# Patient Record
Sex: Female | Born: 1970
Health system: Southern US, Community
[De-identification: ages and names within clinical notes are randomized; demographics above are authoritative.]

## PROBLEM LIST (undated history)

## (undated) DIAGNOSIS — E6609 Other obesity due to excess calories: Secondary | ICD-10-CM

## (undated) DIAGNOSIS — R42 Dizziness and giddiness: Secondary | ICD-10-CM

## (undated) DIAGNOSIS — E119 Type 2 diabetes mellitus without complications: Secondary | ICD-10-CM

## (undated) DIAGNOSIS — IMO0001 Reserved for inherently not codable concepts without codable children: Secondary | ICD-10-CM

## (undated) DIAGNOSIS — E781 Pure hyperglyceridemia: Secondary | ICD-10-CM

## (undated) DIAGNOSIS — I10 Essential (primary) hypertension: Secondary | ICD-10-CM

## (undated) DIAGNOSIS — R51 Headache: Secondary | ICD-10-CM

## (undated) DIAGNOSIS — I519 Heart disease, unspecified: Secondary | ICD-10-CM

## (undated) DIAGNOSIS — R519 Headache, unspecified: Secondary | ICD-10-CM

## (undated) DIAGNOSIS — D649 Anemia, unspecified: Secondary | ICD-10-CM

## (undated) HISTORY — DX: Other obesity due to excess calories: E66.09

## (undated) HISTORY — DX: Essential (primary) hypertension: I10

## (undated) HISTORY — PX: BREAST SURGERY: SHX581

## (undated) HISTORY — DX: Pure hyperglyceridemia: E78.1

## (undated) HISTORY — DX: Headache: R51

## (undated) HISTORY — DX: Heart disease, unspecified: I51.9

## (undated) HISTORY — DX: Type 2 diabetes mellitus without complications: E11.9

## (undated) HISTORY — DX: Headache, unspecified: R51.9

---

## 1989-09-25 HISTORY — PX: BREAST BIOPSY: SHX20

## 1999-03-18 ENCOUNTER — Other Ambulatory Visit: Admission: RE | Admit: 1999-03-18 | Discharge: 1999-03-18 | Payer: Self-pay | Admitting: Obstetrics & Gynecology

## 2001-04-03 ENCOUNTER — Other Ambulatory Visit: Admission: RE | Admit: 2001-04-03 | Discharge: 2001-04-03 | Payer: Self-pay | Admitting: Obstetrics & Gynecology

## 2002-04-04 ENCOUNTER — Other Ambulatory Visit: Admission: RE | Admit: 2002-04-04 | Discharge: 2002-04-04 | Payer: Self-pay | Admitting: Obstetrics & Gynecology

## 2002-08-05 ENCOUNTER — Inpatient Hospital Stay (HOSPITAL_COMMUNITY): Admission: AD | Admit: 2002-08-05 | Discharge: 2002-08-07 | Payer: Self-pay | Admitting: Obstetrics & Gynecology

## 2002-09-11 ENCOUNTER — Other Ambulatory Visit: Admission: RE | Admit: 2002-09-11 | Discharge: 2002-09-11 | Payer: Self-pay | Admitting: Obstetrics & Gynecology

## 2003-05-11 ENCOUNTER — Other Ambulatory Visit: Admission: RE | Admit: 2003-05-11 | Discharge: 2003-05-11 | Payer: Self-pay | Admitting: Obstetrics & Gynecology

## 2004-09-25 HISTORY — PX: HERNIA REPAIR: SHX51

## 2005-09-21 ENCOUNTER — Other Ambulatory Visit: Admission: RE | Admit: 2005-09-21 | Discharge: 2005-09-21 | Payer: Self-pay | Admitting: Obstetrics & Gynecology

## 2007-09-06 ENCOUNTER — Encounter: Admission: RE | Admit: 2007-09-06 | Discharge: 2007-09-06 | Payer: Self-pay | Admitting: Obstetrics & Gynecology

## 2008-03-23 ENCOUNTER — Encounter: Admission: RE | Admit: 2008-03-23 | Discharge: 2008-03-23 | Payer: Self-pay | Admitting: Obstetrics & Gynecology

## 2008-09-14 ENCOUNTER — Encounter: Admission: RE | Admit: 2008-09-14 | Discharge: 2008-09-14 | Payer: Self-pay | Admitting: Obstetrics & Gynecology

## 2009-09-15 ENCOUNTER — Encounter: Admission: RE | Admit: 2009-09-15 | Discharge: 2009-09-15 | Payer: Self-pay | Admitting: Obstetrics & Gynecology

## 2010-04-26 ENCOUNTER — Ambulatory Visit: Payer: Self-pay | Admitting: Cardiology

## 2010-04-27 ENCOUNTER — Ambulatory Visit: Payer: Self-pay | Admitting: Cardiology

## 2010-09-27 ENCOUNTER — Ambulatory Visit: Payer: Self-pay | Admitting: Cardiology

## 2011-05-08 ENCOUNTER — Other Ambulatory Visit: Payer: Self-pay | Admitting: *Deleted

## 2011-05-08 DIAGNOSIS — E7800 Pure hypercholesterolemia, unspecified: Secondary | ICD-10-CM

## 2011-09-13 ENCOUNTER — Encounter: Payer: Self-pay | Admitting: *Deleted

## 2011-09-13 DIAGNOSIS — E781 Pure hyperglyceridemia: Secondary | ICD-10-CM | POA: Insufficient documentation

## 2011-09-13 DIAGNOSIS — R519 Headache, unspecified: Secondary | ICD-10-CM | POA: Insufficient documentation

## 2011-09-13 DIAGNOSIS — E6609 Other obesity due to excess calories: Secondary | ICD-10-CM | POA: Insufficient documentation

## 2011-09-13 DIAGNOSIS — R079 Chest pain, unspecified: Secondary | ICD-10-CM | POA: Insufficient documentation

## 2011-09-15 ENCOUNTER — Other Ambulatory Visit: Payer: Self-pay | Admitting: *Deleted

## 2011-09-15 ENCOUNTER — Ambulatory Visit (INDEPENDENT_AMBULATORY_CARE_PROVIDER_SITE_OTHER): Payer: BC Managed Care – PPO | Admitting: Cardiology

## 2011-09-15 ENCOUNTER — Other Ambulatory Visit (INDEPENDENT_AMBULATORY_CARE_PROVIDER_SITE_OTHER): Payer: BC Managed Care – PPO | Admitting: *Deleted

## 2011-09-15 ENCOUNTER — Encounter: Payer: Self-pay | Admitting: Cardiology

## 2011-09-15 VITALS — BP 138/92 | HR 78 | Ht 64.0 in | Wt 239.0 lb

## 2011-09-15 DIAGNOSIS — E669 Obesity, unspecified: Secondary | ICD-10-CM

## 2011-09-15 DIAGNOSIS — I119 Hypertensive heart disease without heart failure: Secondary | ICD-10-CM

## 2011-09-15 DIAGNOSIS — E6609 Other obesity due to excess calories: Secondary | ICD-10-CM

## 2011-09-15 DIAGNOSIS — E7800 Pure hypercholesterolemia, unspecified: Secondary | ICD-10-CM

## 2011-09-15 LAB — LIPID PANEL
Cholesterol: 130 mg/dL (ref 0–200)
Total CHOL/HDL Ratio: 3

## 2011-09-15 LAB — HEPATIC FUNCTION PANEL
AST: 16 U/L (ref 0–37)
Alkaline Phosphatase: 66 U/L (ref 39–117)
Total Protein: 7.2 g/dL (ref 6.0–8.3)

## 2011-09-15 LAB — BASIC METABOLIC PANEL
BUN: 12 mg/dL (ref 6–23)
CO2: 27 mEq/L (ref 19–32)
Calcium: 8.8 mg/dL (ref 8.4–10.5)
Chloride: 103 mEq/L (ref 96–112)
Creatinine, Ser: 0.5 mg/dL (ref 0.4–1.2)
Potassium: 4.3 mEq/L (ref 3.5–5.1)
Sodium: 138 mEq/L (ref 135–145)

## 2011-09-15 MED ORDER — VALSARTAN-HYDROCHLOROTHIAZIDE 80-12.5 MG PO TABS: 1.0000 | ORAL_TABLET | Freq: Every day | ORAL | Status: DC

## 2011-09-15 NOTE — Progress Notes (Signed)
Savannah Wise Date of Birth:  [birth year] [facility] / [facility] [address]   [address] [place], [place]  [address] [phone]           Fax   [phone]  HPI: This pleasant 40 year old woman is seen for a scheduled followup office visit.  She has a past history of labile hypertension.  She has not been on medication for this.  As we last saw her she has gained 21 pounds.  She has been experiencing some shortness of breath and some puffiness of her hands and face but not her ankles.  She has been noticing her pulse races if she exercises much.  Current Outpatient Prescriptions  Medication Sig Dispense Refill  . Acetaminophen (TYLENOL PO) Take by mouth as needed.        . valsartan-hydrochlorothiazide (DIOVAN HCT) 80-12.5 MG per tablet Take 1 tablet by mouth daily.  90 tablet  3    Allergies  Allergen Reactions  . Penicillins     Patient Active Problem List  Diagnoses  . Chest pain  . HA (headache)  . Exogenous obesity  . Hypertriglyceridemia    History  Smoking status  . Never Smoker   Smokeless tobacco  . Not on file    History  Alcohol Use     No family history on file.  Review of Systems: The patient denies any heat or cold intolerance.  No weight gain or weight loss.  The patient denies headaches or blurry vision.  There is no cough or sputum production.  The patient denies dizziness.  There is no hematuria or hematochezia.  The patient denies any muscle aches or arthritis.  The patient denies any rash.  The patient denies frequent falling or instability.  There is no history of depression or anxiety.  All other systems were reviewed and are negative.   Physical Exam: Filed Vitals:   09/15/11 1007  BP: 138/92  Pulse: 78   The general appearance reveals a well-developed well-nourished moderately overweight woman in no distress.The head and neck exam reveals pupils equal and reactive.  Extraocular movements are full.  There is no scleral  icterus.  The mouth and pharynx are normal.  The neck is supple.  The carotids reveal no bruits.  The jugular venous pressure is normal.  The  thyroid is not enlarged.  There is no lymphadenopathy.  The chest is clear to percussion and auscultation.  There are no rales or rhonchi.  Expansion of the chest is symmetrical.  The precordium is quiet.  The first heart sound is normal.  The second heart sound is physiologically split.  There is no murmur gallop rub or click.  There is no abnormal lift or heave.  The abdomen is soft and nontender.  The bowel sounds are normal.  The liver and spleen are not enlarged.  There are no abdominal masses.  There are no abdominal bruits.  Extremities reveal good pedal pulses.  There is no phlebitis or edema.  There is no cyanosis or clubbing.  Strength is normal and symmetrical in all extremities.  There is no lateralizing weakness.  There are no sensory deficits.  The skin is warm and dry.  There is no rash.     Assessment / Plan: The patient needs to get back on a prudent low calorie low salt diet.  Also we are going to start her on low-dose blood pressure therapy in the form of Diovan HCT 80/12.5 that she will take  every other day.  This is the same medicine that her husband is on and they do not have any difficulty getting hold of it when they are back in [place].  Should be rechecked in 6 months for followup office visit and fasting lab work including lipid panel hepatic function panel and basal metabolic panel.  Blood work today is pending

## 2011-09-15 NOTE — Patient Instructions (Signed)
Will start Diovan HCT 80/12.5 mg daily for your blood pressure Will obtain labs today and call you with the results Your physician wants you to follow-up in: 6 months You will receive a reminder letter in the mail two months in advance. If you don't receive a letter, please call our office to schedule the follow-up appointment.

## 2011-09-15 NOTE — Assessment & Plan Note (Signed)
Her blood pressure is elevated today rechecked by me.  She has not been having any headaches but has been having exertional dyspnea and easy fatigue with exertion.  She notes occasional swelling of her hands and face.  She has not been experiencing any chest pain.

## 2011-09-15 NOTE — Assessment & Plan Note (Signed)
The patient has gained 21 pounds since we last saw her a year ago.  She thinks that she gained most of his weight in the past 3 months.  She has knowledge is that she's had less physical activity.

## 2011-09-21 ENCOUNTER — Telehealth: Payer: Self-pay | Admitting: *Deleted

## 2011-09-21 DIAGNOSIS — R739 Hyperglycemia, unspecified: Secondary | ICD-10-CM

## 2011-09-21 NOTE — Telephone Encounter (Signed)
Advised patient

## 2011-09-21 NOTE — Telephone Encounter (Signed)
Message copied by Burnell Blanks on Thu Sep 21, 2011  4:50 PM ------      Message from: Cassell Clement      Created: Sat Sep 16, 2011 11:42 AM       Please report.  The labs are stable.  Continue same meds.  Continue careful diet.      The BS is elevated consistent with diabetes.  BS 167.  Needs to watch diet more carefully and lose the extra weight.  Diabetic diet--low carbs.   Add A1C at her next labs in 6 month

## 2011-10-02 ENCOUNTER — Other Ambulatory Visit: Payer: Self-pay | Admitting: Obstetrics & Gynecology

## 2011-10-02 DIAGNOSIS — R928 Other abnormal and inconclusive findings on diagnostic imaging of breast: Secondary | ICD-10-CM

## 2012-04-19 ENCOUNTER — Ambulatory Visit
Admission: RE | Admit: 2012-04-19 | Discharge: 2012-04-19 | Disposition: A | Discharge status: Home / Self Care | Payer: BC Managed Care – PPO | Source: Ambulatory Visit | Attending: Obstetrics & Gynecology | Admitting: Obstetrics & Gynecology

## 2012-04-19 DIAGNOSIS — R928 Other abnormal and inconclusive findings on diagnostic imaging of breast: Secondary | ICD-10-CM

## 2012-04-29 ENCOUNTER — Ambulatory Visit (INDEPENDENT_AMBULATORY_CARE_PROVIDER_SITE_OTHER): Payer: BC Managed Care – PPO | Admitting: Cardiology

## 2012-04-29 ENCOUNTER — Other Ambulatory Visit (INDEPENDENT_AMBULATORY_CARE_PROVIDER_SITE_OTHER): Payer: BC Managed Care – PPO

## 2012-04-29 ENCOUNTER — Encounter: Payer: Self-pay | Admitting: Cardiology

## 2012-04-29 VITALS — BP 114/69 | HR 88 | Ht 64.0 in | Wt 230.0 lb

## 2012-04-29 DIAGNOSIS — R7309 Other abnormal glucose: Secondary | ICD-10-CM

## 2012-04-29 DIAGNOSIS — I119 Hypertensive heart disease without heart failure: Secondary | ICD-10-CM

## 2012-04-29 DIAGNOSIS — E781 Pure hyperglyceridemia: Secondary | ICD-10-CM

## 2012-04-29 DIAGNOSIS — R739 Hyperglycemia, unspecified: Secondary | ICD-10-CM

## 2012-04-29 LAB — HEPATIC FUNCTION PANEL
ALT: 21 U/L (ref 0–35)
AST: 14 U/L (ref 0–37)
Bilirubin, Direct: 0.1 mg/dL (ref 0.0–0.3)

## 2012-04-29 LAB — BASIC METABOLIC PANEL
CO2: 26 mEq/L (ref 19–32)
Calcium: 9.2 mg/dL (ref 8.4–10.5)
Potassium: 4.1 mEq/L (ref 3.5–5.1)
Sodium: 135 mEq/L (ref 135–145)

## 2012-04-29 LAB — LIPID PANEL
HDL: 36.7 mg/dL — ABNORMAL LOW (ref >39.00)
Total CHOL/HDL Ratio: 4
VLDL: 50 mg/dL — ABNORMAL HIGH (ref 0.0–40.0)

## 2012-04-29 MED ORDER — VALSARTAN-HYDROCHLOROTHIAZIDE 80-12.5 MG PO TABS: 1.0000 | ORAL_TABLET | Freq: Every day | ORAL | Status: DC

## 2012-04-29 NOTE — Assessment & Plan Note (Signed)
The patient has a history of hypertriglyceridemia and we are checking fasting lab work today

## 2012-04-29 NOTE — Assessment & Plan Note (Signed)
Patient has not had any recent chest pain. He is trying to watch her diet and she does walk on a regular basis

## 2012-04-29 NOTE — Patient Instructions (Addendum)
Your physician recommends that you continue on your current medications as directed. Please refer to the Current Medication list given to you today.  Your physician recommends that you schedule a follow-up appointment in: office visit end of December

## 2012-04-29 NOTE — Progress Notes (Signed)
  [name] Date of Birth:  December 14, 1970 [facility] [address] [place], [place]  [address] [phone]  Fax   [phone]  HPI: This pleasant 41 year old woman is seen for a six-month followup office visit.  He has a past history of exogenous obesity and mild essential hypertension.  Since we last saw her she has lost 9 pounds.  He has been on Diovan HCT but takes it only intermittently if she develops ankle edema.  If she takes it on a regular basis it makes her feel tired and weak. Current Outpatient Prescriptions  Medication Sig Dispense Refill  . Acetaminophen (TYLENOL PO) Take by mouth as needed.        . valsartan-hydrochlorothiazide (DIOVAN HCT) 80-12.5 MG per tablet Take 1 tablet by mouth daily.  60 tablet  5    Allergies  Allergen Reactions  . Penicillins     Patient Active Problem List  Diagnosis  . Chest pain  . HA (headache)  . Exogenous obesity  . Hypertriglyceridemia  . Benign hypertensive heart disease without heart failure    History  Smoking status  . Never Smoker   Smokeless tobacco  . Not on file    History  Alcohol Use     No family history on file.  Review of Systems: The patient denies any heat or cold intolerance.  No weight gain or weight loss.  The patient denies headaches or blurry vision.  There is no cough or sputum production.  The patient denies dizziness.  There is no hematuria or hematochezia.  The patient denies any muscle aches or arthritis.  The patient denies any rash.  The patient denies frequent falling or instability.  There is no history of depression or anxiety.  All other systems were reviewed and are negative.   Physical Exam: Filed Vitals:   04/29/12 0944  BP: 114/69  Pulse: 88   General appearance reveals a well-developed somewhat obese woman in no acute distress.The head and neck exam reveals pupils equal and reactive.  Extraocular movements are full.  There is no scleral icterus.  The mouth  and pharynx are normal.  The neck is supple.  The carotids reveal no bruits.  The jugular venous pressure is normal.  The  thyroid is not enlarged.  There is no lymphadenopathy.  The chest is clear to percussion and auscultation.  There are no rales or rhonchi.  Expansion of the chest is symmetrical.  The precordium is quiet.  The first heart sound is normal.  The second heart sound is physiologically split.  There is no murmur gallop rub or click.  There is no abnormal lift or heave.  The abdomen is soft and nontender.  The bowel sounds are normal.  The liver and spleen are not enlarged.  There are no abdominal masses.  There are no abdominal bruits.  Extremities reveal good pedal pulses.  There is no phlebitis or edema.  There is no cyanosis or clubbing.  Strength is normal and symmetrical in all extremities.  There is no lateralizing weakness.  There are no sensory deficits.  The skin is warm and dry.  There is no rash.    Assessment / Plan: Her blood pressure today is quite satisfactory.  It is okay for her to take the valsartan HCTZ on the as needed basis based on her blood pressure or her peripheral edema. Await results of today's lab work.  Recheck in December for followup office visit

## 2012-05-06 ENCOUNTER — Encounter: Payer: Self-pay | Admitting: Family Medicine

## 2012-05-06 ENCOUNTER — Ambulatory Visit (INDEPENDENT_AMBULATORY_CARE_PROVIDER_SITE_OTHER): Payer: BC Managed Care – PPO | Admitting: Family Medicine

## 2012-05-06 VITALS — BP 120/84 | HR 80 | Temp 97.8°F | Resp 12 | Ht 63.5 in | Wt 234.0 lb

## 2012-05-06 DIAGNOSIS — R5383 Other fatigue: Secondary | ICD-10-CM

## 2012-05-06 DIAGNOSIS — E781 Pure hyperglyceridemia: Secondary | ICD-10-CM

## 2012-05-06 DIAGNOSIS — I119 Hypertensive heart disease without heart failure: Secondary | ICD-10-CM

## 2012-05-06 DIAGNOSIS — E6609 Other obesity due to excess calories: Secondary | ICD-10-CM

## 2012-05-06 DIAGNOSIS — E669 Obesity, unspecified: Secondary | ICD-10-CM

## 2012-05-06 DIAGNOSIS — R5381 Other malaise: Secondary | ICD-10-CM

## 2012-05-06 DIAGNOSIS — E119 Type 2 diabetes mellitus without complications: Secondary | ICD-10-CM

## 2012-05-06 MED ORDER — METFORMIN HCL 500 MG PO TABS: 500.0000 mg | ORAL_TABLET | Freq: Every day | ORAL | Status: DC

## 2012-05-06 NOTE — Progress Notes (Signed)
  Subjective:    Patient ID: Savannah Wise, female    DOB: 08-09-1971, 41 y.o.   MRN: [id number]  HPI  New patient to establish care. Patient has history of questionable hypertension though she is not taking blood pressure medication regularly. She has prescription for valsartan HCTZ but apparently only takes for edema issues. She's had some dizziness when taking this regularly. She has history of obesity, dyslipidemia and by recent labs type 2 diabetes. Recent fasting blood sugar 221 with hemoglobin A1c of 11%. She denies symptoms of hyperglycemia. She has family history of type 2 diabetes in both parents as well as hypertension and premature CAD. Her mother also has hypothyroidism. Patient has not any recent weight gain has had difficulty losing weight and increased fatigue and fluid retention. She is requesting TSH. Recent lab significant for high triglycerides and low HDL.  Patient lives in [place]. 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 Nonsmoker. No alcohol use.  Past Medical History  Diagnosis Date  . Hypertension   . Chest pain   . HA (headache)   . Exogenous obesity     mild  . Hypertriglyceridemia    Past Surgical History  Procedure Date  . Hernia repair 2006    abdominal  . Breast surgery     breast biopsy 2010    reports that she has never smoked. She does not have any smokeless tobacco history on file. Her alcohol and drug histories not on file. family history includes Arthritis in her father; Diabetes in her father and mother; Heart disease in her father and mother; Hyperlipidemia in her father and mother; and Hypertension in her father and mother. Allergies  Allergen Reactions  . Penicillins       Review of Systems  Constitutional: Positive for fatigue. Negative for fever, appetite change and unexpected weight change.  HENT: Positive for sore throat.   Eyes: Negative for visual disturbance.  Respiratory: Negative for cough, chest tightness, shortness of breath and  wheezing.   Cardiovascular: Negative for chest pain, palpitations and leg swelling.  Neurological: Negative for dizziness, seizures, syncope, weakness, light-headedness and headaches.       Objective:   Physical Exam  Constitutional:       Alert pleasant moderately obese 41 year old female  HENT:  Right Ear: External ear normal.  Left Ear: External ear normal.  Mouth/Throat: Oropharynx is clear and moist.  Neck: Neck supple. No thyromegaly present.  Cardiovascular: Normal rate and regular rhythm.   Pulmonary/Chest: Effort normal and breath sounds normal. No respiratory distress. She has no wheezes. She has no rales.  Musculoskeletal: She exhibits no edema.          Assessment & Plan:  #1 type 2 diabetes. New-onset. Discussed importance of weight loss and establishing regular exercise. Offered nutritional referral and she's not interested at this time. Start metformin 500 mg once daily. Reduce sugars and starches. Recheck A1c in 3 months  #2 reported history of hypertension. Blood pressure currently stable off medication.  #3 dyslipidemia. High triglycerides which should improve with weight loss and diabetes control

## 2012-05-06 NOTE — Patient Instructions (Signed)
Work on weight loss and reducing sugar and starch intake.

## 2012-05-07 ENCOUNTER — Encounter: Payer: Self-pay | Admitting: Family Medicine

## 2012-05-07 NOTE — Progress Notes (Signed)
Quick Note:  Pt informed on home VM ______ 

## 2012-05-08 ENCOUNTER — Telehealth: Payer: Self-pay | Admitting: *Deleted

## 2012-05-08 NOTE — Telephone Encounter (Signed)
Copy given and Rx sent to pharmacy per husband

## 2012-05-08 NOTE — Telephone Encounter (Signed)
Message copied by Burnell Blanks on Wed May 08, 2012  1:15 PM ------      Message from: Cassell Clement      Created: Mon Apr 29, 2012  9:14 PM       The tests show that she has developed diabetes.  A1C is very high.  BS high 221. TGs high c/w diabetes also.      Needs to be on a weight reduction diabetes diet.  Also start metformin 500 mg daily with breakfast. Add A1C to next lab work when she returns in December.

## 2012-09-23 ENCOUNTER — Ambulatory Visit (INDEPENDENT_AMBULATORY_CARE_PROVIDER_SITE_OTHER): Payer: BC Managed Care – PPO | Admitting: Cardiology

## 2012-09-23 ENCOUNTER — Encounter: Payer: Self-pay | Admitting: Cardiology

## 2012-09-23 ENCOUNTER — Other Ambulatory Visit (INDEPENDENT_AMBULATORY_CARE_PROVIDER_SITE_OTHER): Payer: BC Managed Care – PPO

## 2012-09-23 VITALS — BP 110/82 | HR 76 | Resp 18 | Ht 64.0 in | Wt 232.8 lb

## 2012-09-23 DIAGNOSIS — R635 Abnormal weight gain: Secondary | ICD-10-CM

## 2012-09-23 DIAGNOSIS — E119 Type 2 diabetes mellitus without complications: Secondary | ICD-10-CM

## 2012-09-23 DIAGNOSIS — I119 Hypertensive heart disease without heart failure: Secondary | ICD-10-CM

## 2012-09-23 LAB — BASIC METABOLIC PANEL
BUN: 12 mg/dL (ref 6–23)
CO2: 29 mEq/L (ref 19–32)

## 2012-09-23 LAB — LIPID PANEL
Cholesterol: 138 mg/dL (ref 0–200)
HDL: 30.6 mg/dL — ABNORMAL LOW (ref >39.00)
Total CHOL/HDL Ratio: 5
VLDL: 34 mg/dL (ref 0.0–40.0)

## 2012-09-23 LAB — HEPATIC FUNCTION PANEL
AST: 14 U/L (ref 0–37)
Alkaline Phosphatase: 63 U/L (ref 39–117)

## 2012-09-23 NOTE — Assessment & Plan Note (Signed)
Blood pressure was remaining stable taking blood pressure medication only about every fourth day or so.  She does try to limit dietary salt

## 2012-09-23 NOTE — Assessment & Plan Note (Signed)
Patient is not having any hypoglycemic episodes.  Her diet does not appear to be particularly strict

## 2012-09-23 NOTE — Progress Notes (Signed)
Savannah Wise Date of Birth:  [birth year] [facility] [address] [place], [place]  [address] [phone]  Fax   [phone]  HPI: This pleasant 41 year old woman is seen for a six-month followup office visit. He has a past history of exogenous obesity and mild essential hypertension. Since we last saw her she has gained 2 pounds.  She is a mild diabetic and is on metformin 500 mg daily.  She has been on Diovan HCT but takes it only intermittently if she develops ankle edema. If she takes it on a regular basis it makes her feel tired and weak.   Current Outpatient Prescriptions  Medication Sig Dispense Refill  . Acetaminophen (TYLENOL PO) Take by mouth as needed.        Marland Kitchen aspirin 81 MG tablet Take 81 mg by mouth daily.      . metFORMIN (GLUCOPHAGE) 500 MG tablet Take 1 tablet (500 mg total) by mouth daily.  90 tablet  3  . valsartan-hydrochlorothiazide (DIOVAN-HCT) 80-12.5 MG per tablet Take 1 tablet by mouth daily. As needed per Dr Patty Sermons        Allergies  Allergen Reactions  . Penicillins     Patient Active Problem List  Diagnosis  . Chest pain  . HA (headache)  . Exogenous obesity  . Hypertriglyceridemia  . Benign hypertensive heart disease without heart failure  . Type 2 diabetes mellitus    History  Smoking status  . Never Smoker   Smokeless tobacco  . Not on file    History  Alcohol Use     Family History  Problem Relation Age of Onset  . Hyperlipidemia Mother   . Heart disease Mother   . Hypertension Mother   . Diabetes Mother   . Arthritis Father   . Hyperlipidemia Father   . Heart disease Father   . Hypertension Father   . Diabetes Father     Review of Systems: The patient denies any heat or cold intolerance.  No weight gain or weight loss.  The patient denies headaches or blurry vision.  There is no cough or sputum production.  The patient denies dizziness.  There is no hematuria or hematochezia.  The patient denies any  muscle aches or arthritis.  The patient denies any rash.  The patient denies frequent falling or instability.  There is no history of depression or anxiety.  All other systems were reviewed and are negative.   Physical Exam: Filed Vitals:   09/23/12 1428  BP: 110/82  Pulse: 76  Resp: 18   the general appearance reveals a well-developed obese middle-aged woman in no distress.The head and neck exam reveals pupils equal and reactive.  Extraocular movements are full.  There is no scleral icterus.  The mouth and pharynx are normal.  The neck is supple.  The carotids reveal no bruits.  The jugular venous pressure is normal.  The  thyroid is not enlarged.  There is no lymphadenopathy.  The chest is clear to percussion and auscultation.  There are no rales or rhonchi.  Expansion of the chest is symmetrical.  The precordium is quiet.  The first heart sound is normal.  The second heart sound is physiologically split.  There is no murmur gallop rub or click.  There is no abnormal lift or heave.  The abdomen is soft and nontender.  The bowel sounds are normal.  The liver and spleen are not enlarged.  There are no abdominal masses.  There are  no abdominal bruits.  Extremities reveal good pedal pulses.  There is no phlebitis or edema.  There is no cyanosis or clubbing.  Strength is normal and symmetrical in all extremities.  There is no lateralizing weakness.  There are no sensory deficits.  The skin is warm and dry.  There is no rash.      Assessment / Plan: Continue same medication.  She will be adding baby aspirin 81 mg daily.  Recheck in 6 months for followup office visit EKG lipid panel hepatic function panel basal metabolic panel TSH and A1c.  Work harder on weight loss and walking exercise.

## 2012-09-23 NOTE — Patient Instructions (Addendum)
ADD ASPIRIN 81 MG DAILY  Your physician wants you to follow-up in: 6 months with fasting labs (lp/bmet/hfp)  You will receive a reminder letter in the mail two months in advance. If you don't receive a letter, please call our office to schedule the follow-up appointment.

## 2012-09-24 ENCOUNTER — Ambulatory Visit (INDEPENDENT_AMBULATORY_CARE_PROVIDER_SITE_OTHER): Payer: BC Managed Care – PPO | Admitting: Family Medicine

## 2012-09-24 ENCOUNTER — Encounter: Payer: Self-pay | Admitting: Family Medicine

## 2012-09-24 VITALS — BP 120/70 | HR 104 | Temp 98.8°F | Wt 232.0 lb

## 2012-09-24 DIAGNOSIS — E119 Type 2 diabetes mellitus without complications: Secondary | ICD-10-CM

## 2012-09-24 DIAGNOSIS — I119 Hypertensive heart disease without heart failure: Secondary | ICD-10-CM

## 2012-09-24 MED ORDER — METFORMIN HCL 1000 MG PO TABS: 1000.0000 mg | ORAL_TABLET | Freq: Two times a day (BID) | ORAL | Status: DC

## 2012-09-24 MED ORDER — VALSARTAN-HYDROCHLOROTHIAZIDE 80-12.5 MG PO TABS: 1.0000 | ORAL_TABLET | Freq: Every day | ORAL | Status: DC

## 2012-09-24 NOTE — Patient Instructions (Addendum)
Increase metformin to 1,000 mg once daily After 2 weeks if fasting blood sugars not consistently < 130 increase to one tablet in AM and one half at night Then, 2 weeks after that increase metformin further to one tablet twice daily if fasting sugars consistently > 130 Trying to reduce starch intake and try to walk more consistently.

## 2012-09-24 NOTE — Progress Notes (Signed)
  Subjective:    Patient ID: Savannah Wise, female    DOB: [birth year], 41 y.o.   MRN: [id number]  HPI Follow type 2 diabetes. This was diagnosed last summer. She's been on metformin father milligrams once daily. Fasting blood sugars fairly consistently over 200. No symptoms of thirst or urine frequency. No weight loss. She has poor dietary compliance and consumes significant carbohydrates. No regular exercise. She apparently takes valsartan HCTZ only intermittently for peripheral edema issues. She states her blood pressures usually well controlled.  Recent hemoglobin A1c 12.5%. Patient is planning to move back to [place] for 6 months and return here in June  Past Medical History  Diagnosis Date  . Hypertension   . Chest pain   . HA (headache)   . Exogenous obesity     mild  . Hypertriglyceridemia    Past Surgical History  Procedure Date  . Hernia repair 2006    abdominal  . Breast surgery     breast biopsy 2010    reports that she has never smoked. She has never used smokeless tobacco. She reports that she does not drink alcohol or use illicit drugs. family history includes Arthritis in her father; Diabetes in her father and mother; Heart disease in her father and mother; Hyperlipidemia in her father and mother; and Hypertension in her father and mother. Allergies  Allergen Reactions  . Penicillins       Review of Systems  Constitutional: Negative for fatigue.  Eyes: Negative for visual disturbance.  Respiratory: Negative for cough, chest tightness, shortness of breath and wheezing.   Cardiovascular: Negative for chest pain, palpitations and leg swelling.  Neurological: Negative for dizziness, seizures, syncope, weakness, light-headedness and headaches.       Objective:   Physical Exam  Constitutional: She appears well-developed and well-nourished.  Neck: Neck supple. No thyromegaly present.  Cardiovascular: Normal rate, regular rhythm and normal heart sounds.     Pulmonary/Chest: Effort normal and breath sounds normal. No respiratory distress. She has no wheezes. She has no rales.  Skin:       Feet reveal no skin lesions. Good distal foot pulses. Good capillary refill. No calluses. Normal sensation with monofilament testing           Assessment & Plan:  Type 2 diabetes. Poorly controlled. We discussed the importance of regular exercise and weight loss. Dietary factors discussed. Titrate metformin to 1000 mg daily and we gave her slow titration regimen over the next month to 1000 mg twice a day if blood sugars remain elevated. She may additional medication. Would avoid sulfonylureas with her weight issues. She's leaving country soon and will return in 6 months and consider followup with primary care physician in [place] within a month  Hypertension stable. Refilled Diovan HCTZ.

## 2012-09-26 ENCOUNTER — Telehealth: Payer: Self-pay | Admitting: Cardiology

## 2012-09-26 MED ORDER — VALSARTAN-HYDROCHLOROTHIAZIDE 80-12.5 MG PO TABS: 1.0000 | ORAL_TABLET | Freq: Every day | ORAL | Status: DC

## 2012-09-26 NOTE — Telephone Encounter (Signed)
Message copied by Antony Odea on Thu Sep 26, 2012 12:22 PM ------      Message from: Cassell Clement      Created: Mon Sep 23, 2012  8:03 PM       Please report.  The A1C is still very high c/w poorly controlled diabetes.      Increase metformin 500 to two pills each am.  Must lose weight and needs very strict diabetic diet.

## 2012-09-26 NOTE — Telephone Encounter (Signed)
She  is having diabetes taken care of by Dr Caryl Never and he has already adjusted her meds, refilled her bp med- she is going out of the country till June 2014.

## 2012-09-26 NOTE — Telephone Encounter (Signed)
Okay 

## 2012-09-26 NOTE — Telephone Encounter (Signed)
New problem    Returning call back to nurse.   

## 2012-09-30 ENCOUNTER — Encounter: Payer: Self-pay | Admitting: Family Medicine

## 2013-03-31 ENCOUNTER — Other Ambulatory Visit: Payer: Self-pay

## 2013-03-31 ENCOUNTER — Telehealth: Payer: Self-pay | Admitting: Family Medicine

## 2013-03-31 DIAGNOSIS — Z1231 Encounter for screening mammogram for malignant neoplasm of breast: Secondary | ICD-10-CM

## 2013-03-31 NOTE — Telephone Encounter (Signed)
i would proceed with future labs already in computer-A1C, BMP, Lipid, TSH, hepatic.

## 2013-03-31 NOTE — Telephone Encounter (Addendum)
Pt would like to know if she needs labs at her follow up appt in August? Husband states she usually gets them done and there are standing orders in computer. OK to schedule? This is not a CPX.

## 2013-04-01 NOTE — Telephone Encounter (Addendum)
lmom as husband requested/kh

## 2013-04-21 ENCOUNTER — Ambulatory Visit
Admission: RE | Admit: 2013-04-21 | Discharge: 2013-04-21 | Disposition: A | Discharge status: Home / Self Care | Payer: BC Managed Care – PPO | Source: Ambulatory Visit

## 2013-04-21 DIAGNOSIS — Z1231 Encounter for screening mammogram for malignant neoplasm of breast: Secondary | ICD-10-CM

## 2013-04-22 ENCOUNTER — Other Ambulatory Visit: Payer: Self-pay | Admitting: Family Medicine

## 2013-04-22 DIAGNOSIS — R928 Other abnormal and inconclusive findings on diagnostic imaging of breast: Secondary | ICD-10-CM

## 2013-04-22 DIAGNOSIS — N63 Unspecified lump in unspecified breast: Secondary | ICD-10-CM

## 2013-04-24 ENCOUNTER — Other Ambulatory Visit (INDEPENDENT_AMBULATORY_CARE_PROVIDER_SITE_OTHER): Payer: BC Managed Care – PPO

## 2013-04-24 ENCOUNTER — Encounter: Payer: Self-pay | Admitting: Family Medicine

## 2013-04-24 DIAGNOSIS — I119 Hypertensive heart disease without heart failure: Secondary | ICD-10-CM

## 2013-04-24 DIAGNOSIS — E119 Type 2 diabetes mellitus without complications: Secondary | ICD-10-CM

## 2013-04-24 DIAGNOSIS — R635 Abnormal weight gain: Secondary | ICD-10-CM

## 2013-04-24 LAB — TSH: TSH: 2.65 u[IU]/mL (ref 0.35–5.50)

## 2013-04-24 LAB — HEPATIC FUNCTION PANEL
AST: 18 U/L (ref 0–37)
Albumin: 3.7 g/dL (ref 3.5–5.2)
Alkaline Phosphatase: 69 U/L (ref 39–117)
Total Protein: 7.3 g/dL (ref 6.0–8.3)

## 2013-04-24 LAB — BASIC METABOLIC PANEL
Calcium: 9.6 mg/dL (ref 8.4–10.5)
Creatinine, Ser: 0.5 mg/dL (ref 0.4–1.2)
GFR: 139.47 mL/min (ref >60.00)

## 2013-04-24 LAB — HEMOGLOBIN A1C: Hgb A1c MFr Bld: 12.2 % — ABNORMAL HIGH (ref 4.6–6.5)

## 2013-04-24 LAB — LIPID PANEL
Cholesterol: 139 mg/dL (ref 0–200)
VLDL: 91.8 mg/dL — ABNORMAL HIGH (ref 0.0–40.0)

## 2013-04-24 NOTE — Progress Notes (Signed)
Quick Note:  Please make copy of labs for patient visit. ______ 

## 2013-04-30 ENCOUNTER — Encounter: Payer: Self-pay | Admitting: Cardiology

## 2013-04-30 ENCOUNTER — Ambulatory Visit (INDEPENDENT_AMBULATORY_CARE_PROVIDER_SITE_OTHER): Payer: BC Managed Care – PPO | Admitting: Cardiology

## 2013-04-30 VITALS — BP 122/92 | HR 88 | Ht 64.0 in | Wt 224.4 lb

## 2013-04-30 DIAGNOSIS — E119 Type 2 diabetes mellitus without complications: Secondary | ICD-10-CM

## 2013-04-30 DIAGNOSIS — I119 Hypertensive heart disease without heart failure: Secondary | ICD-10-CM

## 2013-04-30 MED ORDER — VALSARTAN-HYDROCHLOROTHIAZIDE 160-12.5 MG PO TABS: 1.0000 | ORAL_TABLET | Freq: Every day | ORAL | Status: DC

## 2013-04-30 NOTE — Assessment & Plan Note (Signed)
Her diastolic blood pressure is not adequately controlled despite weight loss.  We will increase Diovan HCT 160/12.5 one daily

## 2013-04-30 NOTE — Assessment & Plan Note (Signed)
Her diabetes is being managed by Dr. Caryl Never.  I have encouraged her to continue with careful diabetic diet and further weight loss.

## 2013-04-30 NOTE — Patient Instructions (Addendum)
Your physician recommends that you schedule a follow-up appointment in: 4 months call for your appointments   Your physician has recommended you make the following change in your medication:  Increase diovan to 160/12.5 mg daily

## 2013-04-30 NOTE — Progress Notes (Signed)
Savannah Wise Date of Birth:  04-29-1971 [facility] [address] [place], [place]  [address] [phone]  Fax   [phone]  HPI: This pleasant 42 year old woman is seen for a six-month followup office visit.  She has a past history of exogenous obesity and mild essential hypertension. Since we last saw her she has lost 8 pounds. She is a  diabetic and is on metformin.  Her recent hemoglobin A1c obtained by Dr. Caryl Never is still elevated at about 12.  She checks her blood pressure at home.  Her diastolic is frequently in the range of 90 or more.  I encouraged her to be sure to take her Diovan every day and we are also increasing the strength of her Diovan HCT today.  She has not been having any chest pain or shortness of breath.  She has has some discomfort in her stomach possibly related to a previous umbilical hernia repair which was performed about 6 years ago in [place] and she is concerned that there may be some recurrence.   Current Outpatient Prescriptions  Medication Sig Dispense Refill  . Acetaminophen (TYLENOL PO) Take by mouth as needed.        Marland Kitchen aspirin 81 MG tablet Take 81 mg by mouth daily.      . metFORMIN (GLUCOPHAGE) 1000 MG tablet Take 1 tablet (1,000 mg total) by mouth 2 (two) times daily with a meal.  360 tablet  0  . valsartan-hydrochlorothiazide (DIOVAN HCT) 160-12.5 MG per tablet Take 1 tablet by mouth daily.  90 tablet  3   No current facility-administered medications for this visit.    Allergies  Allergen Reactions  . Penicillins     Patient Active Problem List   Diagnosis Date Noted  . Type 2 diabetes mellitus 05/06/2012  . Benign hypertensive heart disease without heart failure 09/15/2011  . Chest pain   . HA (headache)   . Exogenous obesity   . Hypertriglyceridemia     History  Smoking status  . Never Smoker   Smokeless tobacco  . Never Used    History  Alcohol Use No    Family History  Problem Relation Age of Onset    . Hyperlipidemia Mother   . Heart disease Mother   . Hypertension Mother   . Diabetes Mother   . Arthritis Father   . Hyperlipidemia Father   . Heart disease Father   . Hypertension Father   . Diabetes Father     Review of Systems: The patient denies any heat or cold intolerance.  No weight gain or weight loss.  The patient denies headaches or blurry vision.  There is no cough or sputum production.  The patient denies dizziness.  There is no hematuria or hematochezia.  The patient denies any muscle aches or arthritis.  The patient denies any rash.  The patient denies frequent falling or instability.  There is no history of depression or anxiety.  All other systems were reviewed and are negative.   Physical Exam: Filed Vitals:   04/30/13 1410  BP: 122/92  Pulse: 88   the general appearance reveals a somewhat overweight woman in no acute distress.The head and neck exam reveals pupils equal and reactive.  Extraocular movements are full.  There is no scleral icterus.  The mouth and pharynx are normal.  The neck is supple.  The carotids reveal no bruits.  The jugular venous pressure is normal.  The  thyroid is not enlarged.  There is  no lymphadenopathy.  The chest is clear to percussion and auscultation.  There are no rales or rhonchi.  Expansion of the chest is symmetrical.  The precordium is quiet.  The first heart sound is normal.  The second heart sound is physiologically split.  There is no murmur gallop rub or click.  There is no abnormal lift or heave.  The abdomen is soft and nontender.  The bowel sounds are normal.  The liver and spleen are not enlarged.  There are no abdominal masses.  The area just above the umbilicus palpates firm and she is concerned that there may be a recurrence of her previous umbilical hernia.  The mesh is palpable below the skin.  There are no abdominal bruits.  Extremities reveal good pedal pulses.  There is no phlebitis or edema.  There is no cyanosis or  clubbing.  Strength is normal and symmetrical in all extremities.  There is no lateralizing weakness.  There are no sensory deficits.  The skin is warm and dry.  There is no rash.   EKG today shows sinus rhythm and nonspecific T-wave flattening   Assessment / Plan: Inadequate blood pressure control.  Increase Diovan up to 160/12.5 one daily Ongoing diabetes control per Dr. Caryl Never. Consider surgical consultation regarding her concerns about recurrent umbilical hernia.  Will defer to Dr. Caryl Never. Recheck here in December for followup office visit

## 2013-05-01 ENCOUNTER — Encounter: Payer: Self-pay | Admitting: Family Medicine

## 2013-05-01 ENCOUNTER — Ambulatory Visit (INDEPENDENT_AMBULATORY_CARE_PROVIDER_SITE_OTHER): Payer: BC Managed Care – PPO | Admitting: Family Medicine

## 2013-05-01 VITALS — BP 124/82 | HR 96 | Temp 98.9°F | Wt 226.0 lb

## 2013-05-01 DIAGNOSIS — E781 Pure hyperglyceridemia: Secondary | ICD-10-CM

## 2013-05-01 DIAGNOSIS — I119 Hypertensive heart disease without heart failure: Secondary | ICD-10-CM

## 2013-05-01 DIAGNOSIS — E119 Type 2 diabetes mellitus without complications: Secondary | ICD-10-CM

## 2013-05-01 MED ORDER — METFORMIN HCL 1000 MG PO TABS: 1000.0000 mg | ORAL_TABLET | Freq: Two times a day (BID) | ORAL | Status: DC

## 2013-05-01 MED ORDER — VALSARTAN-HYDROCHLOROTHIAZIDE 160-12.5 MG PO TABS: 1.0000 | ORAL_TABLET | Freq: Every day | ORAL | Status: DC

## 2013-05-01 MED ORDER — GLIMEPIRIDE 4 MG PO TABS: 4.0000 mg | ORAL_TABLET | Freq: Every day | ORAL | Status: DC

## 2013-05-01 NOTE — Progress Notes (Signed)
  Subjective:    Patient ID: Savannah Wise, female    DOB: May 01, 1971, 42 y.o.   MRN: [id number]  HPI Patient seen for medical followup She has history of obesity, type 2 diabetes, hypertension, dyslipidemia She takes apparently metformin 500 mg twice daily -though medication list is 1000 mg twice daily. She and her husband live in [place] most of the  here year and are here visiting. She takes valsartan HCTZ for hypertension. Takes baby aspirin one daily. She has history of high triglycerides but low HDL.  Blood sugars apparently varied between 150 and low 200s fasting. No symptoms of hyperglycemia. She eats lots of rice and high carbohydrate foods.  Past Medical History  Diagnosis Date  . Hypertension   . Chest pain   . HA (headache)   . Exogenous obesity     mild  . Hypertriglyceridemia   . Diabetes mellitus without complication     type 3 dx 2013   Past Surgical History  Procedure Laterality Date  . Hernia repair  2006    abdominal  . Breast surgery      breast biopsy 2010    reports that she has never smoked. She has never used smokeless tobacco. She reports that she does not drink alcohol or use illicit drugs. family history includes Arthritis in her father; Diabetes in her father and mother; Heart disease in her father and mother; Hyperlipidemia in her father and mother; and Hypertension in her father and mother. Allergies  Allergen Reactions  . Penicillins       Review of Systems  Constitutional: Negative for fatigue.  Eyes: Negative for visual disturbance.  Respiratory: Negative for cough, chest tightness, shortness of breath and wheezing.   Cardiovascular: Negative for chest pain, palpitations and leg swelling.  Endocrine: Negative for polydipsia and polyuria.  Neurological: Negative for dizziness, seizures, syncope, weakness, light-headedness and headaches.       Objective:   Physical Exam  Constitutional: She appears well-developed and well-nourished.   HENT:  Mouth/Throat: Oropharynx is clear and moist.  Neck: Neck supple. No thyromegaly present.  Cardiovascular: Normal rate and regular rhythm.   Pulmonary/Chest: Effort normal and breath sounds normal. No respiratory distress. She has no wheezes. She has no rales.  Musculoskeletal: She exhibits no edema.  Skin:  Feet reveal no skin lesions. Good distal foot pulses. Good capillary refill. No calluses. Normal sensation with monofilament testing           Assessment & Plan:  #1 type 2 diabetes. Poorly controlled. Increase metformin 1000 mg twice a day. Add Amaryl 4 mg once daily. Followup in 4 months at return visit here for repeat A1c. She needs to lose some weight. We discussed strategies #2 hypertension. Well controlled. Refilled Diovan- HCTZ for one year

## 2013-05-01 NOTE — Patient Instructions (Addendum)
Try to lose some weight Reduce sugar and starch intake.

## 2013-05-05 ENCOUNTER — Other Ambulatory Visit: Payer: Self-pay | Admitting: Obstetrics & Gynecology

## 2013-05-05 DIAGNOSIS — N644 Mastodynia: Secondary | ICD-10-CM

## 2013-05-05 DIAGNOSIS — N6310 Unspecified lump in the right breast, unspecified quadrant: Secondary | ICD-10-CM

## 2013-05-07 ENCOUNTER — Ambulatory Visit
Admission: RE | Admit: 2013-05-07 | Discharge: 2013-05-07 | Disposition: A | Discharge status: Home / Self Care | Payer: BC Managed Care – PPO | Source: Ambulatory Visit | Attending: Obstetrics & Gynecology | Admitting: Obstetrics & Gynecology

## 2013-05-07 DIAGNOSIS — N6310 Unspecified lump in the right breast, unspecified quadrant: Secondary | ICD-10-CM

## 2013-05-07 DIAGNOSIS — N644 Mastodynia: Secondary | ICD-10-CM

## 2013-05-09 ENCOUNTER — Other Ambulatory Visit: Payer: BC Managed Care – PPO

## 2013-09-16 ENCOUNTER — Encounter: Payer: Self-pay | Admitting: Family Medicine

## 2013-09-16 ENCOUNTER — Ambulatory Visit (INDEPENDENT_AMBULATORY_CARE_PROVIDER_SITE_OTHER): Payer: BC Managed Care – PPO | Admitting: Family Medicine

## 2013-09-16 VITALS — BP 120/80 | HR 94 | Temp 98.5°F | Wt 230.0 lb

## 2013-09-16 DIAGNOSIS — E781 Pure hyperglyceridemia: Secondary | ICD-10-CM

## 2013-09-16 DIAGNOSIS — E119 Type 2 diabetes mellitus without complications: Secondary | ICD-10-CM

## 2013-09-16 DIAGNOSIS — I119 Hypertensive heart disease without heart failure: Secondary | ICD-10-CM

## 2013-09-16 DIAGNOSIS — E6609 Other obesity due to excess calories: Secondary | ICD-10-CM

## 2013-09-16 DIAGNOSIS — Z23 Encounter for immunization: Secondary | ICD-10-CM

## 2013-09-16 DIAGNOSIS — E669 Obesity, unspecified: Secondary | ICD-10-CM

## 2013-09-16 LAB — BASIC METABOLIC PANEL
BUN: 12 mg/dL (ref 6–23)
CO2: 28 mEq/L (ref 19–32)
Chloride: 100 mEq/L (ref 96–112)
Creatinine, Ser: 0.5 mg/dL (ref 0.4–1.2)
Glucose, Bld: 137 mg/dL — ABNORMAL HIGH (ref 70–99)

## 2013-09-16 LAB — HEPATIC FUNCTION PANEL
ALT: 24 U/L (ref 0–35)
Albumin: 4 g/dL (ref 3.5–5.2)
Bilirubin, Direct: 0 mg/dL (ref 0.0–0.3)
Total Protein: 7.5 g/dL (ref 6.0–8.3)

## 2013-09-16 LAB — LIPID PANEL: Total CHOL/HDL Ratio: 5

## 2013-09-16 LAB — LDL CHOLESTEROL, DIRECT: Direct LDL: 67.4 mg/dL

## 2013-09-16 NOTE — Progress Notes (Signed)
Pre visit review using our clinic review tool, if applicable. No additional management support is needed unless otherwise documented below in the visit note. 

## 2013-09-16 NOTE — Addendum Note (Signed)
Addended by: Thomasena Edis on: 09/16/2013 10:55 AM   Modules accepted: Orders

## 2013-09-16 NOTE — Progress Notes (Signed)
   Subjective:    Patient ID: Savannah Wise, female    DOB: 1970/10/10, 42 y.o.   MRN: [id number]  HPI Patient seen for medical followup. She lives in [place] mostly year and is here visiting. She has chronic problems of obesity, dyslipidemia, type 2 diabetes, hypertension She remains on valsartan and HCTZ for hypertension and blood pressure well controlled. No recent dizziness. She takes metformin and Amaryl for diabetes. Blood sugars have improved with Amaryl. Unfortunately, she has gained some additional weight. Recent thyroid function normal.  She denies symptoms of hyperglycemia. No recent chest pains. No dizziness. Fasting blood sugars mostly ranging 130-150. Most recent A1c was over 12 which is poorly controlled. She's had less fatigue since improved diabetes control  Past Medical History  Diagnosis Date  . Hypertension   . Chest pain   . HA (headache)   . Exogenous obesity     mild  . Hypertriglyceridemia   . Diabetes mellitus without complication     type 3 dx 2013   Past Surgical History  Procedure Laterality Date  . Hernia repair  2006    abdominal  . Breast surgery      breast biopsy 2010    reports that she has never smoked. She has never used smokeless tobacco. She reports that she does not drink alcohol or use illicit drugs. family history includes Arthritis in her father; Diabetes in her father and mother; Heart disease in her father and mother; Hyperlipidemia in her father and mother; Hypertension in her father and mother. Allergies  Allergen Reactions  . Penicillins       Review of Systems  Constitutional: Negative for fatigue and unexpected weight change.  Eyes: Negative for visual disturbance.  Respiratory: Negative for cough, chest tightness, shortness of breath and wheezing.   Cardiovascular: Negative for chest pain, palpitations and leg swelling.  Endocrine: Negative for polydipsia and polyuria.  Neurological: Negative for dizziness, seizures,  syncope, weakness, light-headedness and headaches.       Objective:   Physical Exam  Constitutional: She appears well-developed and well-nourished.  Neck: Neck supple. No thyromegaly present.  Cardiovascular: Normal rate.  Exam reveals no gallop.   Pulmonary/Chest: Effort normal and breath sounds normal. No respiratory distress. She has no wheezes. She has no rales.  Musculoskeletal: She exhibits no edema.  Skin:  Feet reveal no skin lesions. Good distal foot pulses. Good capillary refill. No calluses. Normal sensation with monofilament testing           Assessment & Plan:  #1 type 2 diabetes. History of poor control. History of poor compliance. Recheck A1c. Consider addition of an SG LP2  Inhibitor if blood sugar still not to goal. We strongly advocated to try to lose some weight #2 hypertension. Stable at goal #3 dyslipidemia. Repeat lipid and hepatic panel.

## 2013-09-16 NOTE — Patient Instructions (Signed)
Try to lose some weight. Set up yearly eye exam.

## 2013-09-19 ENCOUNTER — Other Ambulatory Visit: Payer: Self-pay | Admitting: Family Medicine

## 2013-09-19 ENCOUNTER — Other Ambulatory Visit: Payer: Self-pay

## 2013-09-19 DIAGNOSIS — E119 Type 2 diabetes mellitus without complications: Secondary | ICD-10-CM

## 2013-09-19 LAB — HEMOGLOBIN A1C: Hgb A1c MFr Bld: 8.7 % — ABNORMAL HIGH (ref 4.6–6.5)

## 2013-09-19 MED ORDER — METFORMIN HCL 1000 MG PO TABS: 1000.0000 mg | ORAL_TABLET | Freq: Two times a day (BID) | ORAL | Status: DC

## 2013-09-19 MED ORDER — CANAGLIFLOZIN 100 MG PO TABS: 100.0000 mg | ORAL_TABLET | Freq: Every day | ORAL | Status: DC

## 2013-09-19 MED ORDER — VALSARTAN-HYDROCHLOROTHIAZIDE 160-12.5 MG PO TABS: 1.0000 | ORAL_TABLET | Freq: Every day | ORAL | Status: DC

## 2013-09-19 MED ORDER — GLIMEPIRIDE 4 MG PO TABS: 4.0000 mg | ORAL_TABLET | Freq: Every day | ORAL | Status: DC

## 2013-09-22 ENCOUNTER — Ambulatory Visit: Payer: BC Managed Care – PPO | Admitting: Family Medicine

## 2013-09-29 ENCOUNTER — Encounter: Payer: Self-pay | Admitting: Cardiology

## 2013-09-29 ENCOUNTER — Ambulatory Visit (INDEPENDENT_AMBULATORY_CARE_PROVIDER_SITE_OTHER): Payer: BC Managed Care – PPO | Admitting: Cardiology

## 2013-09-29 VITALS — BP 126/68 | HR 96 | Ht 64.0 in | Wt 232.0 lb

## 2013-09-29 DIAGNOSIS — E781 Pure hyperglyceridemia: Secondary | ICD-10-CM

## 2013-09-29 DIAGNOSIS — I119 Hypertensive heart disease without heart failure: Secondary | ICD-10-CM

## 2013-09-29 DIAGNOSIS — E119 Type 2 diabetes mellitus without complications: Secondary | ICD-10-CM

## 2013-09-29 DIAGNOSIS — E669 Obesity, unspecified: Secondary | ICD-10-CM

## 2013-09-29 NOTE — Progress Notes (Signed)
Savannah Wise Date of Birth:  01/10/1971 [facility] [address] [place], [place]  [address] [phone]  Fax   [phone]  HPI: This pleasant 43 year old woman is seen for a six-month followup office visit.  She has a past history of exogenous obesity and mild essential hypertension. Since we last saw her she has lost 8 pounds. She is a  diabetic and is on metformin.    She checks her blood pressure at home.  Her blood pressure has improved since we increased the dose of her Diovan HCT and her last visit.  She has not been having any chest pain or shortness of breath.  She has been using a treadmill at home for exercise.  Her weight is up 8 pounds since last visit.  Recent labs showed normal cholesterol and LDL but triglycerides are still elevated.   Current Outpatient Prescriptions  Medication Sig Dispense Refill  . Acetaminophen (TYLENOL PO) Take by mouth as needed.        Marland Kitchen aspirin 81 MG tablet Take 81 mg by mouth daily.      Marland Kitchen glimepiride (AMARYL) 4 MG tablet Take 1 tablet (4 mg total) by mouth daily before breakfast.  90 tablet  3  . metFORMIN (GLUCOPHAGE) 1000 MG tablet Take 1 tablet (1,000 mg total) by mouth 2 (two) times daily with a meal.  180 tablet  3  . valsartan-hydrochlorothiazide (DIOVAN HCT) 160-12.5 MG per tablet Take 1 tablet by mouth daily.  90 tablet  3  . Canagliflozin (INVOKANA) 100 MG TABS Take 1 tablet (100 mg total) by mouth daily.  90 tablet  0   No current facility-administered medications for this visit.    Allergies  Allergen Reactions  . Penicillins     Patient Active Problem List   Diagnosis Date Noted  . Obesity (BMI 30-39.9) 09/16/2013  . Type 2 diabetes mellitus 05/06/2012  . Benign hypertensive heart disease without heart failure 09/15/2011  . Chest pain   . HA (headache)   . Exogenous obesity   . Hypertriglyceridemia     History  Smoking status  . Never Smoker   Smokeless tobacco  . Never Used    History   Alcohol Use No    Family History  Problem Relation Age of Onset  . Hyperlipidemia Mother   . Heart disease Mother   . Hypertension Mother   . Diabetes Mother   . Arthritis Father   . Hyperlipidemia Father   . Heart disease Father   . Hypertension Father   . Diabetes Father     Review of Systems: The patient denies any heat or cold intolerance.  No weight gain or weight loss.  The patient denies headaches or blurry vision.  There is no cough or sputum production.  The patient denies dizziness.  There is no hematuria or hematochezia.  The patient denies any muscle aches or arthritis.  The patient denies any rash.  The patient denies frequent falling or instability.  There is no history of depression or anxiety.  All other systems were reviewed and are negative.   Physical Exam: Filed Vitals:   09/29/13 1557  BP: 126/68  Pulse: 96   the general appearance reveals a somewhat overweight woman in no acute distress.The head and neck exam reveals pupils equal and reactive.  Extraocular movements are full.  There is no scleral icterus.  The mouth and pharynx are normal.  The neck is supple.  The carotids reveal no bruits.  The jugular venous pressure is normal.  The  thyroid is not enlarged.  There is no lymphadenopathy.  The chest is clear to percussion and auscultation.  There are no rales or rhonchi.  Expansion of the chest is symmetrical.  The precordium is quiet.  The first heart sound is normal.  The second heart sound is physiologically split.  There is no murmur gallop rub or click.  There is no abnormal lift or heave.  The abdomen is soft and nontender.  The bowel sounds are normal.  The liver and spleen are not enlarged.  There are no abdominal masses.  The area just above the umbilicus palpates firm and she is concerned that there may be a recurrence of her previous umbilical hernia.  The mesh is palpable below the skin.  There are no abdominal bruits.  Extremities reveal good pedal  pulses.  There is no phlebitis or edema.  There is no cyanosis or clubbing.  Strength is normal and symmetrical in all extremities.  There is no lateralizing weakness.  There are no sensory deficits.  The skin is warm and dry.  There is no rash.      Assessment / Plan: Overall the patient is doing well although her blood weight remains elevated.  She will work harder on diet and weight loss.  Recheck in 6 months for office visit EKG lipid panel hepatic function panel and basal metabolic panel.

## 2013-09-29 NOTE — Assessment & Plan Note (Signed)
Pressure is satisfactory on current regimen.  No headaches.  No dizzy spells.  No syncope.  No shortness of breath with ordinary activity

## 2013-09-29 NOTE — Patient Instructions (Signed)
Your physician recommends that you continue on your current medications as directed. Please refer to the Current Medication list given to you today.  Your physician wants you to follow-up in: 6 months with fasting labs (lp/bmet/hfp) ekg  You will receive a reminder letter in the mail two months in advance. If you don't receive a letter, please call our office to schedule the follow-up appointment.

## 2013-09-29 NOTE — Assessment & Plan Note (Signed)
Despite exercise her weight has gone up 8 pounds.  I've encouraged her to be even more careful with her diet.

## 2013-09-30 ENCOUNTER — Other Ambulatory Visit: Payer: Self-pay | Admitting: *Deleted

## 2013-09-30 DIAGNOSIS — I119 Hypertensive heart disease without heart failure: Secondary | ICD-10-CM

## 2014-03-09 ENCOUNTER — Other Ambulatory Visit: Payer: Self-pay | Admitting: Obstetrics & Gynecology

## 2014-03-09 DIAGNOSIS — Z1231 Encounter for screening mammogram for malignant neoplasm of breast: Secondary | ICD-10-CM

## 2014-04-15 ENCOUNTER — Other Ambulatory Visit (INDEPENDENT_AMBULATORY_CARE_PROVIDER_SITE_OTHER): Payer: BC Managed Care – PPO

## 2014-04-15 DIAGNOSIS — E119 Type 2 diabetes mellitus without complications: Secondary | ICD-10-CM

## 2014-04-15 DIAGNOSIS — I119 Hypertensive heart disease without heart failure: Secondary | ICD-10-CM

## 2014-04-15 LAB — LIPID PANEL
Cholesterol: 127 mg/dL (ref 0–200)
HDL: 29.2 mg/dL — ABNORMAL LOW (ref >39.00)
NonHDL: 97.8
Total CHOL/HDL Ratio: 4
Triglycerides: 311 mg/dL — ABNORMAL HIGH (ref 0.0–149.0)
VLDL: 62.2 mg/dL — ABNORMAL HIGH (ref 0.0–40.0)

## 2014-04-15 LAB — HEPATIC FUNCTION PANEL
ALT: 28 U/L (ref 0–35)
AST: 19 U/L (ref 0–37)
Albumin: 3.7 g/dL (ref 3.5–5.2)
Alkaline Phosphatase: 60 U/L (ref 39–117)
BILIRUBIN DIRECT: 0 mg/dL (ref 0.0–0.3)
Total Bilirubin: 0.5 mg/dL (ref 0.2–1.2)
Total Protein: 7.3 g/dL (ref 6.0–8.3)

## 2014-04-15 LAB — BASIC METABOLIC PANEL
BUN: 14 mg/dL (ref 6–23)
CALCIUM: 9.3 mg/dL (ref 8.4–10.5)
CO2: 29 mEq/L (ref 19–32)
Chloride: 97 mEq/L (ref 96–112)
Creatinine, Ser: 0.5 mg/dL (ref 0.4–1.2)
GFR: 132.81 mL/min (ref >60.00)
GLUCOSE: 150 mg/dL — AB (ref 70–99)
Potassium: 4.4 mEq/L (ref 3.5–5.1)
Sodium: 135 mEq/L (ref 135–145)

## 2014-04-15 LAB — LDL CHOLESTEROL, DIRECT: Direct LDL: 63.9 mg/dL

## 2014-04-15 LAB — HEMOGLOBIN A1C: HEMOGLOBIN A1C: 10.6 % — AB (ref 4.6–6.5)

## 2014-04-15 NOTE — Progress Notes (Signed)
Quick Note:  Please make copy of labs for patient visit. ______ 

## 2014-04-19 ENCOUNTER — Other Ambulatory Visit: Payer: Self-pay | Admitting: Family Medicine

## 2014-04-21 ENCOUNTER — Encounter: Payer: Self-pay | Admitting: Cardiology

## 2014-04-21 ENCOUNTER — Ambulatory Visit (INDEPENDENT_AMBULATORY_CARE_PROVIDER_SITE_OTHER): Payer: BC Managed Care – PPO | Admitting: Cardiology

## 2014-04-21 VITALS — BP 110/80 | HR 94 | Ht 63.0 in | Wt 237.1 lb

## 2014-04-21 DIAGNOSIS — E1165 Type 2 diabetes mellitus with hyperglycemia: Secondary | ICD-10-CM

## 2014-04-21 DIAGNOSIS — I119 Hypertensive heart disease without heart failure: Secondary | ICD-10-CM

## 2014-04-21 DIAGNOSIS — E119 Type 2 diabetes mellitus without complications: Secondary | ICD-10-CM

## 2014-04-21 DIAGNOSIS — R635 Abnormal weight gain: Secondary | ICD-10-CM

## 2014-04-21 NOTE — Progress Notes (Signed)
[name] Date of Birth:  1971-01-31 [facility] [address] [place], [place]  [address] [phone]  Fax   [phone]  HPI: This pleasant 43 year old woman is seen for a six-month followup office visit.  She has a past history of exogenous obesity and mild essential hypertension.  She is a diabetic.  Since last visit she has been feeling well.  She has occasional headaches in the morning relieved by aspirin.  She has not been having any chest pain or shortness of breath.  She has just come back from a three-week vacation in [place] and has gained 5 pounds. Current Outpatient Prescriptions  Medication Sig Dispense Refill  . Acetaminophen (TYLENOL PO) Take by mouth as needed.        Marland Kitchen aspirin 81 MG tablet Take 81 mg by mouth daily.      Marland Kitchen glimepiride (AMARYL) 4 MG tablet Take 1 tablet (4 mg total) by mouth daily before breakfast.  90 tablet  3  . INVOKANA 100 MG TABS TAKE ONE TABLET BY MOUTH ONCE DAILY.  90 tablet  1  . metFORMIN (GLUCOPHAGE) 1000 MG tablet Take 1 tablet (1,000 mg total) by mouth 2 (two) times daily with a meal.  180 tablet  3  . valsartan-hydrochlorothiazide (DIOVAN HCT) 160-12.5 MG per tablet Take 1 tablet by mouth daily.  90 tablet  3   No current facility-administered medications for this visit.    Allergies  Allergen Reactions  . Penicillins     Patient Active Problem List   Diagnosis Date Noted  . Obesity (BMI 30-39.9) 09/16/2013  . Type 2 diabetes mellitus 05/06/2012  . Benign hypertensive heart disease without heart failure 09/15/2011  . Chest pain   . HA (headache)   . Exogenous obesity   . Hypertriglyceridemia     History  Smoking status  . Never Smoker   Smokeless tobacco  . Never Used    History  Alcohol Use No    Family History  Problem Relation Age of Onset  . Hyperlipidemia Mother   . Heart disease Mother   . Hypertension Mother   . Diabetes Mother   . Arthritis Father   . Hyperlipidemia Father   .  Heart disease Father   . Hypertension Father   . Diabetes Father     Review of Systems: The patient denies any heat or cold intolerance.  No weight gain or weight loss.  The patient denies headaches or blurry vision.  There is no cough or sputum production.  The patient denies dizziness.  There is no hematuria or hematochezia.  The patient denies any muscle aches or arthritis.  The patient denies any rash.  The patient denies frequent falling or instability.  There is no history of depression or anxiety.  All other systems were reviewed and are negative.   Physical Exam: Filed Vitals:   04/21/14 1537  BP: 110/80  Pulse: 94   the general appearance is that of a somewhat overweight middle-aged woman in no distress.The head and neck exam reveals pupils equal and reactive.  Extraocular movements are full.  There is no scleral icterus.  The mouth and pharynx are normal.  The neck is supple.  The carotids reveal no bruits.  The jugular venous pressure is normal.  The  thyroid is not enlarged.  There is no lymphadenopathy.  The chest is clear to percussion and auscultation.  There are no rales or rhonchi.  Expansion of the chest is  symmetrical.  The precordium is quiet.  The first heart sound is normal.  The second heart sound is physiologically split.  There is no murmur gallop rub or click.  There is no abnormal lift or heave.  The abdomen is soft and nontender.  The bowel sounds are normal.  The liver and spleen are not enlarged.  There are no abdominal masses.  There are no abdominal bruits.  Extremities reveal good pedal pulses.  There is no phlebitis or edema.  There is no cyanosis or clubbing.  Strength is normal and symmetrical in all extremities.  There is no lateralizing weakness.  There are no sensory deficits.  The skin is warm and dry.  There is no rash.  EKG today shows normal sinus rhythm and is within normal limits.    Assessment / Plan: 1. hypertensive heart disease without heart  failure 2. diabetes mellitus type 2 3. exogenous obesity  Plan: Continue same cardiac medications.  Continue close followup with Dr. Caryl NeverBurchette regarding diabetic management.  The importance of weight loss and careful diet was stressed. Try to increase aerobic exercise.  Recheck in December for office visit lipid panel hepatic function panel basal metabolic panel and A1c

## 2014-04-21 NOTE — Patient Instructions (Signed)
Your physician recommends that you continue on your current medications as directed. Please refer to the Current Medication list given to you today.  Your physician recommends that you schedule a follow-up appointment in: in December with fasting labs (lp/bmet/hfp)  Work on diet and weight loss

## 2014-04-21 NOTE — Assessment & Plan Note (Signed)
Her type 2 diabetes is not under good control.  Her most recent A1c is above 10.  She has gained 5 pounds since last visit.  She is due to see her PCP Dr. Caryl NeverBurchette tomorrow.  I talked to her about the importance of careful diet and weight loss.

## 2014-04-21 NOTE — Assessment & Plan Note (Signed)
The patient's blood pressure was remaining normal on current therapy.  No chest pain or shortness of breath

## 2014-04-21 NOTE — Assessment & Plan Note (Signed)
The patient has had no chest pain since last visit

## 2014-04-22 ENCOUNTER — Encounter: Payer: Self-pay | Admitting: Family Medicine

## 2014-04-22 ENCOUNTER — Ambulatory Visit (INDEPENDENT_AMBULATORY_CARE_PROVIDER_SITE_OTHER): Payer: BC Managed Care – PPO | Admitting: Family Medicine

## 2014-04-22 VITALS — BP 128/80 | HR 89 | Temp 98.5°F | Ht 63.0 in | Wt 238.0 lb

## 2014-04-22 DIAGNOSIS — Z23 Encounter for immunization: Secondary | ICD-10-CM

## 2014-04-22 DIAGNOSIS — Z0000 Encounter for general adult medical examination without abnormal findings: Secondary | ICD-10-CM

## 2014-04-22 DIAGNOSIS — E1165 Type 2 diabetes mellitus with hyperglycemia: Secondary | ICD-10-CM

## 2014-04-22 DIAGNOSIS — E119 Type 2 diabetes mellitus without complications: Secondary | ICD-10-CM

## 2014-04-22 NOTE — Patient Instructions (Signed)
Lose some weight Try to get back on daily use of Invokana and then repeat A1C in 3 months. Set up eye exam.

## 2014-04-22 NOTE — Progress Notes (Signed)
Pre visit review using our clinic review tool, if applicable. No additional management support is needed unless otherwise documented below in the visit note. 

## 2014-04-22 NOTE — Progress Notes (Signed)
Subjective:    Patient ID: Savannah Wise, female    DOB: 1971/09/17, 43 y.o.   MRN: [id number]  HPI Patient seen for complete physical. She actually has a gynecologist that she sees for Pap smears. She and her husband are here from [place] for one month. She has type 2 diabetes, obesity, dyslipidemia, hypertension. Poor compliance with diet. No regular exercise. Last tetanus reportedly less than 10 years ago. No history of pneumonia vaccine. Needs followup eye exam. No symptoms of hyperglycemia. She's been taking her metformin and glimepiride regularly but not taking her Invokana regularly because of unavailability in  [place].  Past Medical History  Diagnosis Date  . Hypertension   . Chest pain   . HA (headache)   . Exogenous obesity     mild  . Hypertriglyceridemia   . Diabetes mellitus without complication     type 3 dx 2013   Past Surgical History  Procedure Laterality Date  . Hernia repair  2006    abdominal  . Breast surgery      breast biopsy 2010    reports that she has never smoked. She has never used smokeless tobacco. She reports that she does not drink alcohol or use illicit drugs. family history includes Arthritis in her father; Diabetes in her father and mother; Heart disease in her father and mother; Hyperlipidemia in her father and mother; Hypertension in her father and mother. Allergies  Allergen Reactions  . Penicillins       Review of Systems  Constitutional: Negative for fever, activity change, appetite change, fatigue and unexpected weight change.  HENT: Negative for ear pain, hearing loss, sore throat and trouble swallowing.   Eyes: Negative for visual disturbance.  Respiratory: Negative for cough and shortness of breath.   Cardiovascular: Negative for chest pain and palpitations.  Gastrointestinal: Negative for abdominal pain, diarrhea, constipation and blood in stool.  Endocrine: Negative for polydipsia and polyuria.  Genitourinary: Negative  for dysuria and hematuria.  Musculoskeletal: Negative for arthralgias, back pain and myalgias.  Skin: Negative for rash.  Neurological: Negative for dizziness, syncope and headaches.  Hematological: Negative for adenopathy.  Psychiatric/Behavioral: Negative for confusion and dysphoric mood.       Objective:   Physical Exam  Constitutional: She is oriented to person, place, and time. She appears well-developed and well-nourished.  HENT:  Head: Normocephalic and atraumatic.  Eyes: EOM are normal. Pupils are equal, round, and reactive to light.  Neck: Normal range of motion. Neck supple. No thyromegaly present.  Cardiovascular: Normal rate, regular rhythm and normal heart sounds.   No murmur heard. Pulmonary/Chest: Breath sounds normal. No respiratory distress. She has no wheezes. She has no rales.  Abdominal: Soft. Bowel sounds are normal. She exhibits no distension and no mass. There is no tenderness. There is no rebound and no guarding.  Genitourinary:  Per gyn  Musculoskeletal: Normal range of motion. She exhibits no edema.  Lymphadenopathy:    She has no cervical adenopathy.  Neurological: She is alert and oriented to person, place, and time. She displays normal reflexes. No cranial nerve deficit.  Skin: No rash noted.  Feet no lesions.  Psychiatric: She has a normal mood and affect. Her behavior is normal. Judgment and thought content normal.          Assessment & Plan:  Complete physical. Had a long talk regarding importance of weight loss. Her diabetes is poorly controlled. Set up eye exam. We discussed insulin. She is very  reluctant. She prefers 6 month trial of weight loss and then recheck A1c.  Also, needs to take Invokana consistently.   If not improving at that point, we certainly need to look at insulin. She will look at options of trying to get her Invokana filled. Prevnar 13 given (secondary to diabetes dx).

## 2014-05-08 ENCOUNTER — Ambulatory Visit
Admission: RE | Admit: 2014-05-08 | Discharge: 2014-05-08 | Disposition: A | Discharge status: Home / Self Care | Payer: BC Managed Care – PPO | Source: Ambulatory Visit | Attending: Obstetrics & Gynecology | Admitting: Obstetrics & Gynecology

## 2014-05-08 DIAGNOSIS — Z1231 Encounter for screening mammogram for malignant neoplasm of breast: Secondary | ICD-10-CM

## 2014-09-14 ENCOUNTER — Other Ambulatory Visit (INDEPENDENT_AMBULATORY_CARE_PROVIDER_SITE_OTHER): Payer: Self-pay | Admitting: General Surgery

## 2014-09-14 DIAGNOSIS — K439 Ventral hernia without obstruction or gangrene: Secondary | ICD-10-CM

## 2014-09-16 ENCOUNTER — Ambulatory Visit (INDEPENDENT_AMBULATORY_CARE_PROVIDER_SITE_OTHER): Payer: BC Managed Care – PPO | Admitting: Family Medicine

## 2014-09-16 ENCOUNTER — Other Ambulatory Visit (INDEPENDENT_AMBULATORY_CARE_PROVIDER_SITE_OTHER): Payer: BC Managed Care – PPO | Admitting: *Deleted

## 2014-09-16 ENCOUNTER — Encounter: Payer: Self-pay | Admitting: Family Medicine

## 2014-09-16 ENCOUNTER — Ambulatory Visit (INDEPENDENT_AMBULATORY_CARE_PROVIDER_SITE_OTHER): Payer: BC Managed Care – PPO

## 2014-09-16 VITALS — BP 110/70 | HR 83 | Temp 98.6°F | Wt 225.0 lb

## 2014-09-16 DIAGNOSIS — E119 Type 2 diabetes mellitus without complications: Secondary | ICD-10-CM

## 2014-09-16 DIAGNOSIS — E1165 Type 2 diabetes mellitus with hyperglycemia: Secondary | ICD-10-CM

## 2014-09-16 DIAGNOSIS — I119 Hypertensive heart disease without heart failure: Secondary | ICD-10-CM

## 2014-09-16 DIAGNOSIS — E669 Obesity, unspecified: Secondary | ICD-10-CM

## 2014-09-16 DIAGNOSIS — Z23 Encounter for immunization: Secondary | ICD-10-CM

## 2014-09-16 DIAGNOSIS — E781 Pure hyperglyceridemia: Secondary | ICD-10-CM

## 2014-09-16 LAB — BASIC METABOLIC PANEL
BUN: 16 mg/dL (ref 6–23)
CALCIUM: 8.8 mg/dL (ref 8.4–10.5)
CO2: 24 mEq/L (ref 19–32)
Chloride: 105 mEq/L (ref 96–112)
Creatinine, Ser: 0.6 mg/dL (ref 0.4–1.2)
GFR: 119.46 mL/min (ref >60.00)
Glucose, Bld: 142 mg/dL — ABNORMAL HIGH (ref 70–99)
Potassium: 4 mEq/L (ref 3.5–5.1)
SODIUM: 136 meq/L (ref 135–145)

## 2014-09-16 LAB — HEPATIC FUNCTION PANEL
ALBUMIN: 3.7 g/dL (ref 3.5–5.2)
ALT: 21 U/L (ref 0–35)
AST: 14 U/L (ref 0–37)
Alkaline Phosphatase: 52 U/L (ref 39–117)
Bilirubin, Direct: 0 mg/dL (ref 0.0–0.3)
Total Bilirubin: 0.5 mg/dL (ref 0.2–1.2)
Total Protein: 7 g/dL (ref 6.0–8.3)

## 2014-09-16 LAB — LIPID PANEL
CHOLESTEROL: 147 mg/dL (ref 0–200)
HDL: 26.8 mg/dL — AB (ref >39.00)
LDL Cholesterol: 92 mg/dL (ref 0–99)
NonHDL: 120.2
Total CHOL/HDL Ratio: 5
Triglycerides: 143 mg/dL (ref 0.0–149.0)
VLDL: 28.6 mg/dL (ref 0.0–40.0)

## 2014-09-16 LAB — HM DIABETES FOOT EXAM: HM Diabetic Foot Exam: NORMAL

## 2014-09-16 LAB — HM DIABETES EYE EXAM

## 2014-09-16 NOTE — Progress Notes (Signed)
Pre visit review using our clinic review tool, if applicable. No additional management support is needed unless otherwise documented below in the visit note. 

## 2014-09-16 NOTE — Progress Notes (Signed)
   Subjective:    Patient ID: Savannah Wise, female    DOB: 09/07/1971, 43 y.o.   MRN: [id number]  HPI Medical follow-up. History of obesity, type 2 diabetes, dyslipidemia, hypertension. She started walking since last visit and has been more consistently taking her Invokana and has also made dietary changes with reducing sugars and starches. Her blood sugars improved greatly. She had recent A1c 7.9% back in November from her physician in [place]. Overall she feels much better. Last A1c had been 10.6. She remains on valsartan HCTZ. Blood pressure stable. Still needs flu vaccine. Eye exam last summer.  Past Medical History  Diagnosis Date  . Hypertension   . Chest pain   . HA (headache)   . Exogenous obesity     mild  . Hypertriglyceridemia   . Diabetes mellitus without complication     type 3 dx 2013   Past Surgical History  Procedure Laterality Date  . Hernia repair  2006    abdominal  . Breast surgery      breast biopsy 2010    reports that she has never smoked. She has never used smokeless tobacco. She reports that she does not drink alcohol or use illicit drugs. family history includes Arthritis in her father; Diabetes in her father and mother; Heart disease in her father and mother; Hyperlipidemia in her father and mother; Hypertension in her father and mother. Allergies  Allergen Reactions  . Penicillins       Review of Systems  Constitutional: Negative for fatigue.  Eyes: Negative for visual disturbance.  Respiratory: Negative for cough, chest tightness, shortness of breath and wheezing.   Cardiovascular: Negative for chest pain, palpitations and leg swelling.  Neurological: Negative for dizziness, seizures, syncope, weakness, light-headedness and headaches.       Objective:   Physical Exam  Constitutional: She appears well-developed and well-nourished.  Cardiovascular: Normal rate and regular rhythm.   Pulmonary/Chest: Effort normal and breath sounds normal. No  respiratory distress. She has no wheezes. She has no rales.  Skin:  Feet reveal no skin lesions. Good distal foot pulses. Good capillary refill. No calluses. Normal sensation with monofilament testing           Assessment & Plan:  #1 type 2 diabetes. Improving control with lifestyle changes and additional medication above with recent A1C 7.9%. We explained that we could increase her Invokana 300 mg but at this point she prefers to continue 100 mg dose and lifestyle modification. Recheck A1c in 3-4 months #2 hypertension stable and at goal #3 dyslipidemia. Lipids recently checked this morning and reviewed with patient. Her triglycerides are improving. LDL at goal. Continue weight loss efforts. #4 health maintenance. Flu vaccine given

## 2014-09-20 NOTE — Progress Notes (Signed)
Quick Note:  Please make copy of labs for patient visit. ______ 

## 2014-09-21 ENCOUNTER — Encounter: Payer: Self-pay | Admitting: Cardiology

## 2014-09-21 ENCOUNTER — Ambulatory Visit
Admission: RE | Admit: 2014-09-21 | Discharge: 2014-09-21 | Disposition: A | Discharge status: Home / Self Care | Payer: BC Managed Care – PPO | Source: Ambulatory Visit | Attending: General Surgery | Admitting: General Surgery

## 2014-09-21 ENCOUNTER — Ambulatory Visit (INDEPENDENT_AMBULATORY_CARE_PROVIDER_SITE_OTHER): Payer: BC Managed Care – PPO | Admitting: Cardiology

## 2014-09-21 VITALS — BP 108/70 | HR 88 | Ht 64.0 in | Wt 227.0 lb

## 2014-09-21 DIAGNOSIS — K439 Ventral hernia without obstruction or gangrene: Secondary | ICD-10-CM

## 2014-09-21 DIAGNOSIS — E781 Pure hyperglyceridemia: Secondary | ICD-10-CM

## 2014-09-21 DIAGNOSIS — I119 Hypertensive heart disease without heart failure: Secondary | ICD-10-CM

## 2014-09-21 DIAGNOSIS — E669 Obesity, unspecified: Secondary | ICD-10-CM

## 2014-09-21 MED ORDER — IOHEXOL 300 MG/ML  SOLN
125.0000 mL | Freq: Once | INTRAMUSCULAR | Status: AC | PRN
  Administered 2014-09-21: 125 mL via INTRAVENOUS

## 2014-09-21 NOTE — Progress Notes (Signed)
March RummageHuda A Felch Date of Birth:  02/12/1971 [facility] [address] [place], [place]  [id number] 346 [phone]  Fax   [phone]  HPI: This pleasant 43 year old woman is seen for a six-month followup office visit.  She has a past history of exogenous obesity and mild essential hypertension.  She is a diabetic.  Since last visit she has been feeling well.  . Current Outpatient Prescriptions  Medication Sig Dispense Refill  . Acetaminophen (TYLENOL PO) Take by mouth as needed.      Marland Kitchen. aspirin 81 MG tablet Take 81 mg by mouth daily.    Marland Kitchen. glimepiride (AMARYL) 4 MG tablet Take 1 tablet (4 mg total) by mouth daily before breakfast. 90 tablet 3  . INVOKANA 100 MG TABS TAKE ONE TABLET BY MOUTH ONCE DAILY. 90 tablet 1  . metFORMIN (GLUCOPHAGE) 1000 MG tablet Take 1 tablet (1,000 mg total) by mouth 2 (two) times daily with a meal. 180 tablet 3  . valsartan-hydrochlorothiazide (DIOVAN HCT) 160-12.5 MG per tablet Take 1 tablet by mouth daily. 90 tablet 3   No current facility-administered medications for this visit.    Allergies  Allergen Reactions  . Iodinated Diagnostic Agents Hives and Rash    S/P 10 MIN AFTER RECEIVING CONTRAST DEVELOPED HIVES UPPER CHEST /NECK AND LEGS , PATIENT RECEIVED 50 MG BENADRYL/MMS  . Omnipaque [Iohexol] Hives, Itching and Swelling  . Penicillins     Patient Active Problem List   Diagnosis Date Noted  . Obesity (BMI 30-39.9) 09/16/2013  . Poorly controlled type 2 diabetes mellitus 05/06/2012  . Benign hypertensive heart disease without heart failure 09/15/2011  . Chest pain   . HA (headache)   . Exogenous obesity   . Hypertriglyceridemia     History  Smoking status  . Never Smoker   Smokeless tobacco  . Never Used    History  Alcohol Use No    Family History  Problem Relation Age of Onset  . Hyperlipidemia Mother   . Heart disease Mother   . Hypertension Mother   . Diabetes Mother   . Arthritis Father   . Hyperlipidemia  Father   . Heart disease Father   . Hypertension Father   . Diabetes Father     Review of Systems: The patient denies any heat or cold intolerance.  No weight gain or weight loss.  The patient denies headaches or blurry vision.  There is no cough or sputum production.  The patient denies dizziness.  There is no hematuria or hematochezia.  The patient denies any muscle aches or arthritis.  The patient denies any rash.  The patient denies frequent falling or instability.  There is no history of depression or anxiety.  All other systems were reviewed and are negative.   Physical Exam: Filed Vitals:   09/21/14 1414  BP: 108/70  Pulse: 88   the general appearance is that of a somewhat overweight middle-aged woman in no distress.The head and neck exam reveals pupils equal and reactive.  Extraocular movements are full.  There is no scleral icterus.  The mouth and pharynx are normal.  The neck is supple.  The carotids reveal no bruits.  The jugular venous pressure is normal.  The  thyroid is not enlarged.  There is no lymphadenopathy.  The chest is clear to percussion and auscultation.  There are no rales or rhonchi.  Expansion of the chest is symmetrical.  The precordium is quiet.  The first heart sound  is normal.  The second heart sound is physiologically split.  There is no murmur gallop rub or click.  There is no abnormal lift or heave.  The abdomen is soft and nontender.  The bowel sounds are normal.  The liver and spleen are not enlarged.  There are no abdominal masses.  There are no abdominal bruits.  Extremities reveal good pedal pulses.  There is no phlebitis or edema.  There is no cyanosis or clubbing.  Strength is normal and symmetrical in all extremities.  There is no lateralizing weakness.  There are no sensory deficits.  The skin is warm and dry.  There is no rash.  EKG today shows normal sinus rhythm and is within normal limits.    Assessment / Plan: 1. hypertensive heart disease without  heart failure 2. diabetes mellitus type 2 3. exogenous obesity. 4.  Allergy to iodinated contrast dye.  She had a reaction to contrast today.  We will get a basal metabolic panel on her tomorrow.  She will hold her metformin until the results of her renal function are known.  Plan: Continue same cardiac medications.  Continue close followup with Dr. Caryl NeverBurchette regarding diabetic management.  The importance of weight loss and careful diet was stressed. Continue regular walking.  Continue weight loss.  She has lost 11 pounds since we last saw her. Recheck in 6 months for follow-up office visit EKG lipid panel hepatic function panel and basal metabolic panel.

## 2014-09-21 NOTE — Patient Instructions (Signed)
Return tomorrow for labs, call the office first to make sure the lab is open [phone]  Your physician recommends that you continue on your current medications as directed. Please refer to the Current Medication list given to you today.  Your physician wants you to follow-up in: 6 months with fasting labs (lp/bmet/hfp) and ekg You will receive a reminder letter in the mail two months in advance. If you don't receive a letter, please call our office to schedule the follow-up appointment.

## 2014-09-22 ENCOUNTER — Other Ambulatory Visit: Payer: Self-pay | Admitting: Family Medicine

## 2014-09-22 ENCOUNTER — Other Ambulatory Visit (INDEPENDENT_AMBULATORY_CARE_PROVIDER_SITE_OTHER): Payer: BC Managed Care – PPO

## 2014-09-22 ENCOUNTER — Other Ambulatory Visit: Payer: Self-pay

## 2014-09-22 ENCOUNTER — Other Ambulatory Visit: Payer: BC Managed Care – PPO

## 2014-09-22 DIAGNOSIS — E781 Pure hyperglyceridemia: Secondary | ICD-10-CM

## 2014-09-22 DIAGNOSIS — I119 Hypertensive heart disease without heart failure: Secondary | ICD-10-CM

## 2014-09-22 LAB — BASIC METABOLIC PANEL
BUN: 13 mg/dL (ref 6–23)
CALCIUM: 9 mg/dL (ref 8.4–10.5)
CO2: 28 meq/L (ref 19–32)
CREATININE: 0.5 mg/dL (ref 0.4–1.2)
Chloride: 101 mEq/L (ref 96–112)
GFR: 129.72 mL/min (ref >60.00)
GLUCOSE: 98 mg/dL (ref 70–99)
Potassium: 5.1 mEq/L (ref 3.5–5.1)
Sodium: 139 mEq/L (ref 135–145)

## 2014-09-22 LAB — HEPATIC FUNCTION PANEL
ALBUMIN: 4.1 g/dL (ref 3.5–5.2)
ALK PHOS: 53 U/L (ref 39–117)
ALT: 18 U/L (ref 0–35)
AST: 13 U/L (ref 0–37)
Bilirubin, Direct: 0 mg/dL (ref 0.0–0.3)
TOTAL PROTEIN: 7.7 g/dL (ref 6.0–8.3)
Total Bilirubin: 0.2 mg/dL (ref 0.2–1.2)

## 2014-09-22 LAB — LIPID PANEL
CHOL/HDL RATIO: 5
Cholesterol: 155 mg/dL (ref 0–200)
HDL: 33.9 mg/dL — ABNORMAL LOW (ref >39.00)
LDL CALC: 91 mg/dL (ref 0–99)
NonHDL: 121.1
Triglycerides: 152 mg/dL — ABNORMAL HIGH (ref 0.0–149.0)
VLDL: 30.4 mg/dL (ref 0.0–40.0)

## 2014-09-22 LAB — HEMOGLOBIN A1C: Hgb A1c MFr Bld: 7.7 % — ABNORMAL HIGH (ref 4.6–6.5)

## 2014-09-22 MED ORDER — CANAGLIFLOZIN 100 MG PO TABS: 100.0000 mg | ORAL_TABLET | Freq: Every day | ORAL | Status: DC

## 2014-09-29 ENCOUNTER — Other Ambulatory Visit: Payer: Self-pay | Admitting: Family Medicine

## 2014-10-07 ENCOUNTER — Encounter: Payer: Self-pay | Admitting: Family Medicine

## 2015-03-03 ENCOUNTER — Other Ambulatory Visit: Payer: Self-pay

## 2015-03-03 ENCOUNTER — Telehealth: Payer: Self-pay | Admitting: Cardiology

## 2015-03-03 DIAGNOSIS — Z1231 Encounter for screening mammogram for malignant neoplasm of breast: Secondary | ICD-10-CM

## 2015-03-30 ENCOUNTER — Encounter: Payer: Self-pay | Admitting: Family Medicine

## 2015-03-30 ENCOUNTER — Encounter: Payer: Self-pay | Admitting: General Surgery

## 2015-03-30 ENCOUNTER — Ambulatory Visit (INDEPENDENT_AMBULATORY_CARE_PROVIDER_SITE_OTHER): Payer: BLUE CROSS/BLUE SHIELD | Admitting: Family Medicine

## 2015-03-30 VITALS — BP 110/80 | HR 85 | Temp 98.2°F | Wt 229.0 lb

## 2015-03-30 DIAGNOSIS — R5383 Other fatigue: Secondary | ICD-10-CM | POA: Diagnosis not present

## 2015-03-30 DIAGNOSIS — H8112 Benign paroxysmal vertigo, left ear: Secondary | ICD-10-CM

## 2015-03-30 DIAGNOSIS — E781 Pure hyperglyceridemia: Secondary | ICD-10-CM

## 2015-03-30 DIAGNOSIS — E119 Type 2 diabetes mellitus without complications: Secondary | ICD-10-CM | POA: Diagnosis not present

## 2015-03-30 DIAGNOSIS — E1165 Type 2 diabetes mellitus with hyperglycemia: Secondary | ICD-10-CM

## 2015-03-30 LAB — HEMOGLOBIN A1C: HEMOGLOBIN A1C: 8 % — AB (ref 4.6–6.5)

## 2015-03-30 LAB — TSH: TSH: 3.09 u[IU]/mL (ref 0.35–4.50)

## 2015-03-30 MED ORDER — METFORMIN HCL 1000 MG PO TABS: 1000.0000 mg | ORAL_TABLET | Freq: Two times a day (BID) | ORAL | Status: DC

## 2015-03-30 NOTE — Patient Instructions (Signed)
Benign Positional Vertigo Vertigo means you feel like you or your surroundings are moving when they are not. Benign positional vertigo is the most common form of vertigo. Benign means that the cause of your condition is not serious. Benign positional vertigo is more common in older adults. CAUSES  Benign positional vertigo is the result of an upset in the labyrinth system. This is an area in the middle ear that helps control your balance. This may be caused by a viral infection, head injury, or repetitive motion. However, often no specific cause is found. SYMPTOMS  Symptoms of benign positional vertigo occur when you move your head or eyes in different directions. Some of the symptoms may include:  Loss of balance and falls.  Vomiting.  Blurred vision.  Dizziness.  Nausea.  Involuntary eye movements (nystagmus). DIAGNOSIS  Benign positional vertigo is usually diagnosed by physical exam. If the specific cause of your benign positional vertigo is unknown, your caregiver may perform imaging tests, such as magnetic resonance imaging (MRI) or computed tomography (CT). TREATMENT  Your caregiver may recommend movements or procedures to correct the benign positional vertigo. Medicines such as meclizine, benzodiazepines, and medicines for nausea may be used to treat your symptoms. In rare cases, if your symptoms are caused by certain conditions that affect the inner ear, you may need surgery. HOME CARE INSTRUCTIONS   Follow your caregiver's instructions.  Move slowly. Do not make sudden body or head movements.  Avoid driving.  Avoid operating heavy machinery.  Avoid performing any tasks that would be dangerous to you or others during a vertigo episode.  Drink enough fluids to keep your urine clear or pale yellow. SEEK IMMEDIATE MEDICAL CARE IF:   You develop problems with walking, weakness, numbness, or using your arms, hands, or legs.  You have difficulty speaking.  You develop  severe headaches.  Your nausea or vomiting continues or gets worse.  You develop visual changes.  Your family or friends notice any behavioral changes.  Your condition gets worse.  You have a fever.  You develop a stiff neck or sensitivity to light. MAKE SURE YOU:   Understand these instructions.  Will watch your condition.  Will get help right away if you are not doing well or get worse. Document Released: 06/19/2006 Document Revised: 12/04/2011 Document Reviewed: 06/01/2011 ExitCare Patient Information 2015 [employer]. This information is not intended to replace advice given to you by your health care provider. Make sure you discuss any questions you have with your health care provider.    

## 2015-03-30 NOTE — Progress Notes (Signed)
Pre visit review using our clinic review tool, if applicable. No additional management support is needed unless otherwise documented below in the visit note. 

## 2015-03-30 NOTE — Progress Notes (Signed)
   Subjective:    Patient ID: Savannah RummageHuda A Rosal, female    DOB: 12/28/1970, 44 y.o.   MRN: [id number]  HPI Medical follow-up.  Type 2 diabetes. Last A1c 6 months ago 7.7%. Fasting blood sugars around 135. Unfortunately, she has not lost any additional weight. No symptoms of polyuria or polydipsia. Denies any neuropathy symptoms. No history of any retinopathy  Occasional vertigo symptoms. Onset a couple weeks ago. Worse first in the morning. Worse turning head to left side. No hearing changes. Denies any dysarthria or swallowing difficulties. No ataxia. Symptoms of been relatively mild  Hypertension which has been stable on valsartan HCTZ. No dizziness.  She does complain of some fatigue and is requesting TSH screen. Only occasional constipation. No recent alopecia. No cold intolerance  Past Medical History  Diagnosis Date  . Hypertension   . Chest pain   . HA (headache)   . Exogenous obesity     mild  . Hypertriglyceridemia   . Diabetes mellitus without complication     type 3 dx 2013   Past Surgical History  Procedure Laterality Date  . Hernia repair  2006    abdominal  . Breast surgery      breast biopsy 2010    reports that she has never smoked. She has never used smokeless tobacco. She reports that she does not drink alcohol or use illicit drugs. family history includes Arthritis in her father; Diabetes in her father and mother; Heart disease in her father and mother; Hyperlipidemia in her father and mother; Hypertension in her father and mother. Allergies  Allergen Reactions  . Iodinated Diagnostic Agents Hives and Rash    S/P 10 MIN AFTER RECEIVING CONTRAST DEVELOPED HIVES UPPER CHEST /NECK AND LEGS , PATIENT RECEIVED 50 MG BENADRYL/MMS  . Omnipaque [Iohexol] Hives, Itching and Swelling  . Penicillins       Review of Systems  Constitutional: Negative for fatigue.  Eyes: Negative for visual disturbance.  Respiratory: Negative for cough, chest tightness, shortness of  breath and wheezing.   Cardiovascular: Negative for chest pain, palpitations and leg swelling.  Neurological: Negative for dizziness, seizures, syncope, weakness, light-headedness and headaches.       Objective:   Physical Exam  Constitutional: She is oriented to person, place, and time. She appears well-developed and well-nourished.  Neck: Neck supple. No thyromegaly present.  Cardiovascular: Normal rate and regular rhythm.   Pulmonary/Chest: Effort normal and breath sounds normal. No respiratory distress. She has no wheezes. She has no rales.  Musculoskeletal: She exhibits no edema.  Neurological: She is alert and oriented to person, place, and time. No cranial nerve deficit. Coordination normal.  Skin:  Feet reveal no skin lesions. Good distal foot pulses. Good capillary refill. No calluses. Normal sensation with monofilament testing           Assessment & Plan:  #1 type 2 diabetes. History of poor control but improving. Recheck A1c. Consider further titration Invokana if A1C still > 7. #2 hypertension. Stable and at goal #3 history of recent vertigo. Suspect benign positional peripheral vertigo.  No worrisome "red flag" symptoms. #4 dyslipidemia- followed by cardiology. #5 fatigue. Pt requesting TSH and will add to lab.

## 2015-03-30 NOTE — Progress Notes (Signed)
Savannah Wise 03/30/2015 4:50 PM Location: [facility] Patient #: [id number] DOB: 03/05/1971 Married / Language: English / Race: Refused to Report/Unreported Female History of Present Illness Savannah Wise(Savannah Wise J. Savannah Firebaugh MD; 03/30/2015 5:24 PM) Patient words: discuss hernia.  The patient is a 44 year old female    Note:She presents today for preoperative visit to discuss repair of recurrent ventral hernia containing fat. This is verified by CT scan. She recently got back into the country from [place]. The hernia is still symptomatic. She is interested in proceeding with repair. Her husband is here with her. She'll be in the country until the end of August but can't stay longer if needed.  Allergies Doristine Devoid(Chemira Jones; 03/30/2015 5:03 PM) Penicillamine *ASSORTED CLASSES*  Medication History Doristine Devoid(Chemira Jones; 03/30/2015 5:03 PM) Aspirin EC (81MG  Tablet DR, Oral) Active. Glimepiride (4MG  Tablet, Oral) Active. Invokana (100MG  Tablet, Oral) Active. MetFORMIN HCl (1000MG  Tablet, Oral) Active. Diovan HCT (160-12.5MG  Tablet, Oral) Active. Medications Reconciled    Vitals Doristine Devoid(Chemira Jones; 03/30/2015 5:01 PM) 03/30/2015 4:50 PM Weight: 228.25 lb Height: 64in Body Surface Area: 2.16 m Body Mass Index: 39.18 kg/m Temp.: 99.42F(Oral)  Pulse: 78 (Regular)  BP: 108/72 (Sitting, Left Arm, Standard)     Physical Exam Savannah Wise(Savannah Wise J. Savannah Utsey MD; 03/30/2015 5:25 PM)  The physical exam findings are as follows: Note:General-overweight female in no acute distress.  Cardiovascular-regular rate and rhythm.  Abdomen-soft and obese, partially reducible supraumbilical bulge, subumbilical scar.    Assessment & Plan Savannah Wise(Savannah Wise J. Savannah Sokol MD; 03/30/2015 5:25 PM)  RECURRENT VENTRAL HERNIA (553.21  K43.2) Impression: This contains fat. It is larger today than it was when I saw her previously. It remains symptomatic. She would like to proceed with repair.  Plan: Laparoscopic repair of recurrent  ventral hernia with mesh. I have discussed the procedure, risks, and aftercare. Risks include but are not limited to bleeding, infection, wound healing problems, anesthesia, recurrence, accidental injury to intra-abdominal organs-such as intestine, liver, spleen, bladder, etc. We also discussed the rare complication of mesh rejection. All questions were answered.  Savannah Peaceodd Jull Harral, MD

## 2015-04-02 ENCOUNTER — Other Ambulatory Visit (INDEPENDENT_AMBULATORY_CARE_PROVIDER_SITE_OTHER): Payer: BLUE CROSS/BLUE SHIELD | Admitting: *Deleted

## 2015-04-02 DIAGNOSIS — I119 Hypertensive heart disease without heart failure: Secondary | ICD-10-CM

## 2015-04-02 LAB — BASIC METABOLIC PANEL
BUN: 16 mg/dL (ref 6–23)
CHLORIDE: 100 meq/L (ref 96–112)
CO2: 26 meq/L (ref 19–32)
CREATININE: 0.5 mg/dL (ref 0.40–1.20)
Calcium: 9.3 mg/dL (ref 8.4–10.5)
GFR: 141.43 mL/min (ref >60.00)
Glucose, Bld: 157 mg/dL — ABNORMAL HIGH (ref 70–99)
Potassium: 4.1 mEq/L (ref 3.5–5.1)
Sodium: 135 mEq/L (ref 135–145)

## 2015-04-04 NOTE — Progress Notes (Signed)
Quick Note:  Please make copy of labs for patient visit. ______ 

## 2015-04-05 ENCOUNTER — Other Ambulatory Visit (INDEPENDENT_AMBULATORY_CARE_PROVIDER_SITE_OTHER): Payer: BLUE CROSS/BLUE SHIELD

## 2015-04-05 DIAGNOSIS — E781 Pure hyperglyceridemia: Secondary | ICD-10-CM

## 2015-04-05 LAB — HEPATIC FUNCTION PANEL
ALBUMIN: 4 g/dL (ref 3.5–5.2)
ALT: 16 U/L (ref 0–35)
AST: 12 U/L (ref 0–37)
Alkaline Phosphatase: 61 U/L (ref 39–117)
BILIRUBIN DIRECT: 0.1 mg/dL (ref 0.0–0.3)
BILIRUBIN TOTAL: 0.4 mg/dL (ref 0.2–1.2)
Total Protein: 7.5 g/dL (ref 6.0–8.3)

## 2015-04-05 LAB — LIPID PANEL
CHOL/HDL RATIO: 4
Cholesterol: 143 mg/dL (ref 0–200)
HDL: 33.6 mg/dL — ABNORMAL LOW (ref >39.00)
LDL CALC: 70 mg/dL (ref 0–99)
NonHDL: 109.4
TRIGLYCERIDES: 199 mg/dL — AB (ref 0.0–149.0)
VLDL: 39.8 mg/dL (ref 0.0–40.0)

## 2015-04-05 NOTE — Progress Notes (Signed)
Quick Note:  Please make copy of labs for patient visit. ______ 

## 2015-04-06 ENCOUNTER — Encounter: Payer: Self-pay | Admitting: Cardiology

## 2015-04-06 ENCOUNTER — Ambulatory Visit (INDEPENDENT_AMBULATORY_CARE_PROVIDER_SITE_OTHER): Payer: BLUE CROSS/BLUE SHIELD | Admitting: Cardiology

## 2015-04-06 VITALS — BP 118/70 | HR 87 | Ht 64.0 in | Wt 226.0 lb

## 2015-04-06 DIAGNOSIS — I119 Hypertensive heart disease without heart failure: Secondary | ICD-10-CM | POA: Diagnosis not present

## 2015-04-06 NOTE — Progress Notes (Signed)
Cardiology Office Note   Date:  04/06/2015   ID:  Savannah Wise, MRN [id number]  PCP:  Kristian CoveyBURCHETTE,BRUCE W, MD  Cardiologist: Cassell Clementhomas Hilton Saephan MD  Chief Complaint  Patient presents with  . Snoring      History of Present Illness: Savannah Wise who presents for a six-month follow-up visit  This pleasant 44 year old woman is seen for a six-month followup office visit. She has a past history of exogenous obesity and mild essential hypertension. She is a diabetic. Since last visit she has been feeling well.She has lost weight since last visit.  Her blood work is improved.  She has not been having any chest pain or shortness of breath.  No palpitations dizziness or syncope.   Past Medical History  Diagnosis Date  . Hypertension   . Chest pain   . HA (headache)   . Exogenous obesity     mild  . Hypertriglyceridemia   . Diabetes mellitus without complication     type 3 dx 2013    Past Surgical History  Procedure Laterality Date  . Hernia repair  2006    abdominal  . Breast surgery      breast biopsy 2010     Current Outpatient Prescriptions  Medication Sig Dispense Refill  . Acetaminophen (TYLENOL PO) Take by mouth as needed.      Marland Kitchen. aspirin 81 MG tablet Take 81 mg by mouth daily.    . canagliflozin (INVOKANA) 100 MG TABS tablet Take 1 tablet (100 mg total) by mouth daily. 90 tablet 3  . glimepiride (AMARYL) 4 MG tablet TAKE ONE TABLET BY MOUTH ONCE DAILY BEFORE BREAKFAST. 90 tablet 3  . metFORMIN (GLUCOPHAGE) 1000 MG tablet Take 1 tablet (1,000 mg total) by mouth 2 (two) times daily with a meal. 180 tablet 3  . valsartan-hydrochlorothiazide (DIOVAN-HCT) 160-12.5 MG per tablet TAKE ONE TABLET BY MOUTH ONCE DAILY 90 tablet 3   No current facility-administered medications for this visit.    Allergies:   Iodinated diagnostic agents; Omnipaque; and Penicillins    Social History:  The patient  reports that she has never smoked. She has  never used smokeless tobacco. She reports that she does not drink alcohol or use illicit drugs.   Family History:  The patient's family history includes Arthritis in her father; Diabetes in her father and mother; Heart disease in her father and mother; Hyperlipidemia in her father and mother; Hypertension in her father and mother.    ROS:  Please see the history of present illness.   Otherwise, review of systems are positive for none.   All other systems are reviewed and negative.    PHYSICAL EXAM: VS:  BP 118/70 mmHg  Pulse 87  Ht 5\' 4"  (1.626 m)  Wt 226 lb (102.513 kg)  BMI 38.77 kg/m2 , BMI Body mass index is 38.77 kg/(m^2). GEN: Well nourished, well developed, in no acute distress HEENT: normal Neck: no JVD, carotid bruits, or masses Cardiac: RRR; no murmurs, rubs, or gallops,no edema  Respiratory:  clear to auscultation bilaterally, normal work of breathing GI: soft, nontender, nondistended, + BS MS: no deformity or atrophy Skin: warm and dry, no rash Neuro:  Strength and sensation are intact Psych: euthymic mood, full affect   EKG:  EKG is ordered today. The ekg ordered today demonstrates normal sinus rhythm, low-voltage QRS, otherwise within normal limits.   Recent Labs: 03/30/2015: TSH 3.09 04/02/2015: BUN 16; Creatinine, Ser 0.50;  Potassium 4.1; Sodium 135 04/05/2015: ALT 16    Lipid Panel    Component Value Date/Time   CHOL 143 04/05/2015 1431   TRIG 199.0* 04/05/2015 1431   HDL 33.60* 04/05/2015 1431   CHOLHDL 4 04/05/2015 1431   VLDL 39.8 04/05/2015 1431   LDLCALC 70 04/05/2015 1431   LDLDIRECT 63.9 04/15/2014 1029      Wt Readings from Last 3 Encounters:  04/06/15 226 lb (102.513 kg)  03/30/15 229 lb (103.874 kg)  09/21/14 227 lb (102.967 kg)        ASSESSMENT AND PLAN:  1. hypertensive heart disease without heart failure 2. diabetes mellitus type 2 3. exogenous obesity. 4. Allergy to iodinated contrast dye. She had a reaction to contrast  today. We will get a basal metabolic panel on her tomorrow. She will hold her metformin until the results of her renal function are known.   Current medicines are reviewed at length with the patient today.  The patient does not have concerns regarding medicines.  The following changes have been made:  no change  Labs/ tests ordered today include:   Orders Placed This Encounter  Procedures  . Hepatic function panel  . Basic metabolic panel  . Lipid panel  . EKG 12-Lead    Disposition: Continue current medication.  See back in December for office visit lipid panel hepatic function panel and basal metabolic panel.  Karie Schwalbe MD 04/06/2015 6:06 PM    [facility] [address] [place], [place], [place]  [address] Phone: [phone]; Fax: [phone]

## 2015-04-06 NOTE — Patient Instructions (Signed)
Medication Instructions:  Your physician recommends that you continue on your current medications as directed. Please refer to the Current Medication list given to you today.  Labwork: NONE  Testing/Procedures: NONE  Follow-Up: Your physician wants you to follow-up in: END OF December WITH FASTING LABS  You will receive a reminder letter in the mail two months in advance. If you don't receive a letter, please call our office to schedule the follow-up appointment.

## 2015-04-07 ENCOUNTER — Other Ambulatory Visit: Payer: Self-pay

## 2015-04-07 MED ORDER — CANAGLIFLOZIN 300 MG PO TABS: 300.0000 mg | ORAL_TABLET | Freq: Every day | ORAL | Status: DC

## 2015-04-08 NOTE — Progress Notes (Signed)
Reason on the consent form order is missing the word hernia .  Thank You.  Preop on 04/12/2015.

## 2015-04-08 NOTE — Patient Instructions (Addendum)
Savannah Wise  04/08/2015   Your procedure is scheduled on: Thursday 04/15/2015    [facility]  Entrance [place]  elevators to 3rd floor to  Short Stay Center at  0715 AM.  Call this number if you have problems the morning of surgery [phone]   Remember: ONLY 1 PERSON MAY GO WITH YOU TO SHORT STAY TO GET  READY MORNING OF YOUR SURGERY.  Do not eat food or drink liquids :After Midnight.                                You may not have any metal on your body including hair pins and              piercings  Do not wear jewelry, make-up, lotions, powders or perfumes, deodorant             Do not wear nail polish.  Do not shave  48 hours prior to surgery.   Do not bring valuables to the hospital. [facility] IS NOT             RESPONSIBLE   FOR VALUABLES.  Contacts, dentures or bridgework may not be worn into surgery.  Leave suitcase in the car. After surgery it may be brought to your room.  _____________________________________________________________________           [facility] - Pre[facility] Before surgery, you can play an important role.  Because skin is not sterile, your skin needs to be as free of germs as possible.  You can reduce the number of germs on your skin by washing with CHG (chlorahexidine gluconate) soap before surgery.  CHG is an antiseptic cleaner which kills germs and bonds with the skin to continue killing germs even after washing. Please DO NOT use if you have an allergy to CHG or antibacterial soaps.  If your skin becomes reddened/irritated stop using the CHG and inform your nurse when you arrive at Short Stay. Do not shave (including legs and underarms) for at least 48 hours prior to the first CHG shower.  You may shave your face/neck. Please follow these instructions carefully:  1.  Shower with CHG Soap the night before surgery and the  morning of Surgery.  2.  If you choose to wash your hair, wash your hair first as  usual with your  normal  shampoo.  3.  After you shampoo, rinse your hair and body thoroughly to remove the  shampoo.                            4.  Use CHG as you would any other liquid soap.  You can apply chg directly  to the skin and wash                       Gently with a scrungie or clean washcloth.  5.  Apply the CHG Soap to your body ONLY FROM THE NECK DOWN.   Do not use on face/ open                           Wound or open sores. Avoid contact with eyes, ears mouth and genitals (private parts).  Wash face,  Genitals (private parts) with your normal soap.             6.  Wash thoroughly, paying special attention to the area where your surgery  will be performed.  7.  Thoroughly rinse your body with warm water from the neck down.  8.  DO NOT shower/wash with your normal soap after using and rinsing off  the CHG Soap.                9.  Pat yourself dry with a clean towel.            10.  Wear clean pajamas.            11.  Place clean sheets on your bed the night of your first shower and do not  sleep with pets. Day of Surgery : Do not apply any lotions/deodorants the morning of surgery.  Please wear clean clothes to the hospital/surgery center.  FAILURE TO FOLLOW THESE INSTRUCTIONS MAY RESULT IN THE CANCELLATION OF YOUR SURGERY PATIENT SIGNATURE_________________________________  NURSE SIGNATURE__________________________________  ________________________________________________________________________

## 2015-04-12 ENCOUNTER — Encounter (HOSPITAL_COMMUNITY): Payer: Self-pay

## 2015-04-12 ENCOUNTER — Ambulatory Visit (HOSPITAL_COMMUNITY)
Admission: RE | Admit: 2015-04-12 | Discharge: 2015-04-12 | Disposition: A | Discharge status: Home / Self Care | Payer: BLUE CROSS/BLUE SHIELD | Source: Ambulatory Visit | Attending: Anesthesiology | Admitting: Anesthesiology

## 2015-04-12 ENCOUNTER — Ambulatory Visit (HOSPITAL_COMMUNITY): Admission: RE | Admit: 2015-04-12 | Payer: BLUE CROSS/BLUE SHIELD | Source: Ambulatory Visit

## 2015-04-12 ENCOUNTER — Encounter (HOSPITAL_COMMUNITY)
Admission: RE | Admit: 2015-04-12 | Discharge: 2015-04-12 | Disposition: A | Discharge status: Home / Self Care | Payer: BLUE CROSS/BLUE SHIELD | Source: Ambulatory Visit | Attending: General Surgery | Admitting: General Surgery

## 2015-04-12 DIAGNOSIS — Z01818 Encounter for other preprocedural examination: Secondary | ICD-10-CM | POA: Insufficient documentation

## 2015-04-12 DIAGNOSIS — I10 Essential (primary) hypertension: Secondary | ICD-10-CM | POA: Diagnosis not present

## 2015-04-12 DIAGNOSIS — R0602 Shortness of breath: Secondary | ICD-10-CM

## 2015-04-12 HISTORY — DX: Dizziness and giddiness: R42

## 2015-04-12 HISTORY — DX: Anemia, unspecified: D64.9

## 2015-04-12 HISTORY — DX: Reserved for inherently not codable concepts without codable children: IMO0001

## 2015-04-12 LAB — CBC WITH DIFFERENTIAL/PLATELET
Basophils Absolute: 0 10*3/uL (ref 0.0–0.1)
Basophils Relative: 0 % (ref 0–1)
Eosinophils Absolute: 0.2 10*3/uL (ref 0.0–0.7)
Eosinophils Relative: 2 % (ref 0–5)
HCT: 40 % (ref 36.0–46.0)
Hemoglobin: 11.2 g/dL — ABNORMAL LOW (ref 12.0–15.0)
Lymphocytes Relative: 30 % (ref 12–46)
Lymphs Abs: 3.5 10*3/uL (ref 0.7–4.0)
MCH: 19 pg — AB (ref 26.0–34.0)
MCHC: 28 g/dL — ABNORMAL LOW (ref 30.0–36.0)
MCV: 68 fL — ABNORMAL LOW (ref 78.0–100.0)
Monocytes Absolute: 0.4 10*3/uL (ref 0.1–1.0)
Monocytes Relative: 3 % (ref 3–12)
NEUTROS ABS: 7.6 10*3/uL (ref 1.7–7.7)
Neutrophils Relative %: 65 % (ref 43–77)
Platelets: 399 10*3/uL (ref 150–400)
RBC: 5.88 MIL/uL — ABNORMAL HIGH (ref 3.87–5.11)
RDW: 14.8 % (ref 11.5–15.5)
WBC: 11.7 10*3/uL — AB (ref 4.0–10.5)

## 2015-04-12 LAB — COMPREHENSIVE METABOLIC PANEL
ALBUMIN: 3.9 g/dL (ref 3.5–5.0)
ALT: 18 U/L (ref 14–54)
AST: 14 U/L — ABNORMAL LOW (ref 15–41)
Alkaline Phosphatase: 58 U/L (ref 38–126)
Anion gap: 9 (ref 5–15)
BUN: 17 mg/dL (ref 6–20)
CHLORIDE: 103 mmol/L (ref 101–111)
CO2: 25 mmol/L (ref 22–32)
CREATININE: 0.58 mg/dL (ref 0.44–1.00)
Calcium: 8.7 mg/dL — ABNORMAL LOW (ref 8.9–10.3)
GLUCOSE: 162 mg/dL — AB (ref 65–99)
POTASSIUM: 4 mmol/L (ref 3.5–5.1)
Sodium: 137 mmol/L (ref 135–145)
Total Bilirubin: 0.2 mg/dL — ABNORMAL LOW (ref 0.3–1.2)
Total Protein: 7.4 g/dL (ref 6.5–8.1)

## 2015-04-12 LAB — PROTIME-INR
INR: 0.94 (ref 0.00–1.49)
Prothrombin Time: 12.8 seconds (ref 11.6–15.2)

## 2015-04-12 LAB — HCG, SERUM, QUALITATIVE: PREG SERUM: NEGATIVE

## 2015-04-12 NOTE — Progress Notes (Signed)
EKG 04/06/15 on EPIC

## 2015-04-15 ENCOUNTER — Ambulatory Visit (HOSPITAL_COMMUNITY): Payer: BLUE CROSS/BLUE SHIELD | Admitting: Registered Nurse

## 2015-04-15 ENCOUNTER — Encounter (HOSPITAL_COMMUNITY)
Admission: RE | Disposition: A | Discharge status: Home / Self Care | Payer: Self-pay | Source: Ambulatory Visit | Attending: General Surgery

## 2015-04-15 ENCOUNTER — Other Ambulatory Visit (INDEPENDENT_AMBULATORY_CARE_PROVIDER_SITE_OTHER): Payer: Self-pay

## 2015-04-15 ENCOUNTER — Ambulatory Visit (HOSPITAL_COMMUNITY)
Admission: RE | Admit: 2015-04-15 | Discharge: 2015-04-16 | Disposition: A | Discharge status: Home / Self Care | Payer: BLUE CROSS/BLUE SHIELD | Source: Ambulatory Visit | Attending: General Surgery | Admitting: General Surgery

## 2015-04-15 ENCOUNTER — Encounter (HOSPITAL_COMMUNITY): Payer: Self-pay | Admitting: *Deleted

## 2015-04-15 DIAGNOSIS — Z880 Allergy status to penicillin: Secondary | ICD-10-CM | POA: Insufficient documentation

## 2015-04-15 DIAGNOSIS — I10 Essential (primary) hypertension: Secondary | ICD-10-CM | POA: Diagnosis not present

## 2015-04-15 DIAGNOSIS — Z79899 Other long term (current) drug therapy: Secondary | ICD-10-CM | POA: Diagnosis not present

## 2015-04-15 DIAGNOSIS — E118 Type 2 diabetes mellitus with unspecified complications: Secondary | ICD-10-CM | POA: Insufficient documentation

## 2015-04-15 DIAGNOSIS — K432 Incisional hernia without obstruction or gangrene: Secondary | ICD-10-CM | POA: Diagnosis present

## 2015-04-15 DIAGNOSIS — Z7982 Long term (current) use of aspirin: Secondary | ICD-10-CM | POA: Insufficient documentation

## 2015-04-15 DIAGNOSIS — I509 Heart failure, unspecified: Secondary | ICD-10-CM | POA: Insufficient documentation

## 2015-04-15 DIAGNOSIS — Z6838 Body mass index (BMI) 38.0-38.9, adult: Secondary | ICD-10-CM | POA: Diagnosis not present

## 2015-04-15 DIAGNOSIS — I252 Old myocardial infarction: Secondary | ICD-10-CM | POA: Diagnosis not present

## 2015-04-15 HISTORY — PX: LAPAROSCOPIC LYSIS OF ADHESIONS: SHX5905

## 2015-04-15 HISTORY — PX: VENTRAL HERNIA REPAIR: SHX424

## 2015-04-15 HISTORY — PX: INSERTION OF MESH: SHX5868

## 2015-04-15 LAB — GLUCOSE, CAPILLARY
GLUCOSE-CAPILLARY: 189 mg/dL — AB (ref 65–99)
GLUCOSE-CAPILLARY: 214 mg/dL — AB (ref 65–99)
Glucose-Capillary: 170 mg/dL — ABNORMAL HIGH (ref 65–99)
Glucose-Capillary: 220 mg/dL — ABNORMAL HIGH (ref 65–99)
Glucose-Capillary: 184 mg/dL — ABNORMAL HIGH (ref 65–99)

## 2015-04-15 SURGERY — REPAIR, HERNIA, VENTRAL, LAPAROSCOPIC
Anesthesia: General | Site: Abdomen

## 2015-04-15 MED ORDER — HEPARIN SODIUM (PORCINE) 5000 UNIT/ML IJ SOLN
5000.0000 [IU] | Freq: Three times a day (TID) | INTRAMUSCULAR | Status: DC
  Administered 2015-04-16: 5000 [IU] via SUBCUTANEOUS
  Filled 2015-04-15 (×4): qty 1

## 2015-04-15 MED ORDER — LACTATED RINGERS IR SOLN
Status: DC | PRN
  Administered 2015-04-15: 1000 mL

## 2015-04-15 MED ORDER — OXYCODONE HCL 5 MG PO TABS: 5.0000 mg | ORAL_TABLET | Freq: Once | ORAL | Status: DC | PRN

## 2015-04-15 MED ORDER — CEFAZOLIN SODIUM-DEXTROSE 2-3 GM-% IV SOLR
INTRAVENOUS | Status: AC
Start: 2015-04-15 — End: 2015-04-15
  Filled 2015-04-15: qty 50

## 2015-04-15 MED ORDER — ACETAMINOPHEN 325 MG PO TABS: 325.0000 mg | ORAL_TABLET | ORAL | Status: DC | PRN

## 2015-04-15 MED ORDER — BUPIVACAINE HCL (PF) 0.5 % IJ SOLN
INTRAMUSCULAR | Status: AC
  Filled 2015-04-15: qty 30

## 2015-04-15 MED ORDER — HYDROMORPHONE HCL 1 MG/ML IJ SOLN
INTRAMUSCULAR | Status: AC
  Filled 2015-04-15: qty 1

## 2015-04-15 MED ORDER — MORPHINE SULFATE 2 MG/ML IJ SOLN
2.0000 mg | INTRAMUSCULAR | Status: DC | PRN
  Administered 2015-04-15: 2 mg via INTRAVENOUS
  Administered 2015-04-15: 4 mg via INTRAVENOUS
  Administered 2015-04-15: 2 mg via INTRAVENOUS
  Administered 2015-04-16 (×2): 4 mg via INTRAVENOUS
  Filled 2015-04-15: qty 1
  Filled 2015-04-15 (×2): qty 2
  Filled 2015-04-15: qty 1
  Filled 2015-04-15: qty 2

## 2015-04-15 MED ORDER — OXYCODONE HCL 5 MG/5ML PO SOLN: 5.0000 mg | Freq: Once | ORAL | Status: DC | PRN

## 2015-04-15 MED ORDER — ONDANSETRON HCL 4 MG/2ML IJ SOLN
INTRAMUSCULAR | Status: DC | PRN
  Administered 2015-04-15: 4 mg via INTRAVENOUS

## 2015-04-15 MED ORDER — ACETAMINOPHEN 160 MG/5ML PO SOLN
325.0000 mg | ORAL | Status: DC | PRN
  Filled 2015-04-15: qty 20.3

## 2015-04-15 MED ORDER — CEFAZOLIN SODIUM-DEXTROSE 2-3 GM-% IV SOLR
2.0000 g | INTRAVENOUS | Status: AC
  Administered 2015-04-15: 2 g via INTRAVENOUS

## 2015-04-15 MED ORDER — GLYCOPYRROLATE 0.2 MG/ML IJ SOLN
INTRAMUSCULAR | Status: AC
  Filled 2015-04-15: qty 3

## 2015-04-15 MED ORDER — 0.9 % SODIUM CHLORIDE (POUR BTL) OPTIME
TOPICAL | Status: DC | PRN
  Administered 2015-04-15: 1000 mL

## 2015-04-15 MED ORDER — LIDOCAINE HCL (CARDIAC) 20 MG/ML IV SOLN
INTRAVENOUS | Status: AC
Start: 2015-04-15 — End: 2015-04-15
  Filled 2015-04-15: qty 5

## 2015-04-15 MED ORDER — NEOSTIGMINE METHYLSULFATE 10 MG/10ML IV SOLN
INTRAVENOUS | Status: DC | PRN
  Administered 2015-04-15: 5 mg via INTRAVENOUS

## 2015-04-15 MED ORDER — DEXAMETHASONE SODIUM PHOSPHATE 10 MG/ML IJ SOLN
INTRAMUSCULAR | Status: AC
  Filled 2015-04-15: qty 1

## 2015-04-15 MED ORDER — FENTANYL CITRATE (PF) 100 MCG/2ML IJ SOLN
INTRAMUSCULAR | Status: DC | PRN
  Administered 2015-04-15 (×5): 50 ug via INTRAVENOUS

## 2015-04-15 MED ORDER — SUCCINYLCHOLINE CHLORIDE 20 MG/ML IJ SOLN
INTRAMUSCULAR | Status: DC | PRN
  Administered 2015-04-15: 100 mg via INTRAVENOUS

## 2015-04-15 MED ORDER — ONDANSETRON HCL 4 MG PO TABS: 4.0000 mg | ORAL_TABLET | Freq: Four times a day (QID) | ORAL | Status: DC | PRN

## 2015-04-15 MED ORDER — MIDAZOLAM HCL 5 MG/5ML IJ SOLN
INTRAMUSCULAR | Status: DC | PRN
  Administered 2015-04-15: 2 mg via INTRAVENOUS

## 2015-04-15 MED ORDER — KCL IN DEXTROSE-NACL 20-5-0.9 MEQ/L-%-% IV SOLN
INTRAVENOUS | Status: DC
  Administered 2015-04-15: 18:00:00 via INTRAVENOUS
  Administered 2015-04-16: 1000 mL via INTRAVENOUS
  Filled 2015-04-15 (×5): qty 1000

## 2015-04-15 MED ORDER — BUPIVACAINE HCL (PF) 0.5 % IJ SOLN
INTRAMUSCULAR | Status: DC | PRN
  Administered 2015-04-15: 10 mL

## 2015-04-15 MED ORDER — ROCURONIUM BROMIDE 100 MG/10ML IV SOLN
INTRAVENOUS | Status: AC
  Filled 2015-04-15: qty 1

## 2015-04-15 MED ORDER — ONDANSETRON HCL 4 MG/2ML IJ SOLN
INTRAMUSCULAR | Status: AC
  Filled 2015-04-15: qty 2

## 2015-04-15 MED ORDER — PROPOFOL 10 MG/ML IV BOLUS
INTRAVENOUS | Status: AC
  Filled 2015-04-15: qty 20

## 2015-04-15 MED ORDER — HYDROMORPHONE HCL 1 MG/ML IJ SOLN
0.2500 mg | INTRAMUSCULAR | Status: DC | PRN
  Administered 2015-04-15 (×2): 0.25 mg via INTRAVENOUS
  Administered 2015-04-15: 0.5 mg via INTRAVENOUS

## 2015-04-15 MED ORDER — LIP MEDEX EX OINT
TOPICAL_OINTMENT | CUTANEOUS | Status: AC
  Administered 2015-04-15: 1
  Filled 2015-04-15: qty 7

## 2015-04-15 MED ORDER — INSULIN ASPART 100 UNIT/ML ~~LOC~~ SOLN
0.0000 [IU] | Freq: Three times a day (TID) | SUBCUTANEOUS | Status: DC
Start: 2015-04-15 — End: 2015-04-16
  Administered 2015-04-15: 5 [IU] via SUBCUTANEOUS
  Administered 2015-04-16: 3 [IU] via SUBCUTANEOUS

## 2015-04-15 MED ORDER — ROCURONIUM BROMIDE 100 MG/10ML IV SOLN
INTRAVENOUS | Status: DC | PRN
  Administered 2015-04-15: 40 mg via INTRAVENOUS
  Administered 2015-04-15 (×2): 10 mg via INTRAVENOUS

## 2015-04-15 MED ORDER — OXYCODONE-ACETAMINOPHEN 5-325 MG PO TABS
1.0000 | ORAL_TABLET | ORAL | Status: DC | PRN
  Administered 2015-04-16 (×2): 1 via ORAL
  Filled 2015-04-15 (×2): qty 1

## 2015-04-15 MED ORDER — KETOROLAC TROMETHAMINE 30 MG/ML IJ SOLN
INTRAMUSCULAR | Status: AC
  Filled 2015-04-15: qty 1

## 2015-04-15 MED ORDER — KETOROLAC TROMETHAMINE 30 MG/ML IJ SOLN
30.0000 mg | Freq: Once | INTRAMUSCULAR | Status: AC
Start: 2015-04-15 — End: 2015-04-15
  Administered 2015-04-15: 30 mg via INTRAVENOUS

## 2015-04-15 MED ORDER — MIDAZOLAM HCL 2 MG/2ML IJ SOLN
INTRAMUSCULAR | Status: AC
  Filled 2015-04-15: qty 2

## 2015-04-15 MED ORDER — LIDOCAINE HCL (CARDIAC) 20 MG/ML IV SOLN
INTRAVENOUS | Status: DC | PRN
  Administered 2015-04-15: 100 mg via INTRAVENOUS

## 2015-04-15 MED ORDER — ONDANSETRON HCL 4 MG/2ML IJ SOLN: 4.0000 mg | INTRAMUSCULAR | Status: DC | PRN

## 2015-04-15 MED ORDER — DEXAMETHASONE SODIUM PHOSPHATE 10 MG/ML IJ SOLN
INTRAMUSCULAR | Status: DC | PRN
  Administered 2015-04-15: 10 mg via INTRAVENOUS

## 2015-04-15 MED ORDER — NEOSTIGMINE METHYLSULFATE 10 MG/10ML IV SOLN
INTRAVENOUS | Status: AC
  Filled 2015-04-15: qty 1

## 2015-04-15 MED ORDER — FENTANYL CITRATE (PF) 250 MCG/5ML IJ SOLN
INTRAMUSCULAR | Status: AC
Start: 2015-04-15 — End: 2015-04-15
  Filled 2015-04-15: qty 5

## 2015-04-15 MED ORDER — LACTATED RINGERS IV SOLN
INTRAVENOUS | Status: DC | PRN
  Administered 2015-04-15 (×3): via INTRAVENOUS

## 2015-04-15 MED ORDER — PROPOFOL 10 MG/ML IV BOLUS
INTRAVENOUS | Status: DC | PRN
  Administered 2015-04-15: 200 mg via INTRAVENOUS

## 2015-04-15 MED ORDER — GLYCOPYRROLATE 0.2 MG/ML IJ SOLN
INTRAMUSCULAR | Status: DC | PRN
  Administered 2015-04-15: 0.6 mg via INTRAVENOUS

## 2015-04-15 SURGICAL SUPPLY — 44 items
APL SKNCLS STERI-STRIP NONHPOA (GAUZE/BANDAGES/DRESSINGS) ×2
BENZOIN TINCTURE PRP APPL 2/3 (GAUZE/BANDAGES/DRESSINGS) ×3 IMPLANT
BINDER ABD UNIV 12 45-62 (WOUND CARE) IMPLANT
BINDER ABDOMINAL 12 ML 46-62 (SOFTGOODS) ×1 IMPLANT
BINDER ABDOMINAL 46IN 62IN (WOUND CARE) ×3
CABLE HIGH FREQUENCY MONO STRZ (ELECTRODE) ×1 IMPLANT
CHLORAPREP W/TINT 26ML (MISCELLANEOUS) ×4 IMPLANT
COVER SURGICAL LIGHT HANDLE (MISCELLANEOUS) ×3 IMPLANT
DECANTER SPIKE VIAL GLASS SM (MISCELLANEOUS) ×3 IMPLANT
DEVICE TROCAR PUNCTURE CLOSURE (ENDOMECHANICALS) ×3 IMPLANT
DISSECTOR BLUNT TIP ENDO 5MM (MISCELLANEOUS) IMPLANT
DRAPE INCISE IOBAN 66X45 STRL (DRAPES) ×2 IMPLANT
DRAPE LAPAROSCOPIC ABDOMINAL (DRAPES) ×3 IMPLANT
DRAPE SURG 17X11 SM STRL (DRAPES) ×1 IMPLANT
DRSG TEGADERM 2-3/8X2-3/4 SM (GAUZE/BANDAGES/DRESSINGS) ×8 IMPLANT
ELECT REM PT RETURN 9FT ADLT (ELECTROSURGICAL) ×3
ELECTRODE REM PT RTRN 9FT ADLT (ELECTROSURGICAL) ×2 IMPLANT
GLOVE ECLIPSE 8.0 STRL XLNG CF (GLOVE) ×3 IMPLANT
GLOVE INDICATOR 8.0 STRL GRN (GLOVE) ×6 IMPLANT
GOWN STRL REUS W/TWL XL LVL3 (GOWN DISPOSABLE) ×8 IMPLANT
KIT BASIN OR (CUSTOM PROCEDURE TRAY) ×3 IMPLANT
MARKER SKIN DUAL TIP RULER LAB (MISCELLANEOUS) ×3 IMPLANT
MESH VENTRALIGHT ST 6X8 (Mesh Specialty) ×3 IMPLANT
MESH VENTRLGHT ELLIPSE 8X6XMFL (Mesh Specialty) IMPLANT
NDL SPNL 22GX3.5 QUINCKE BK (NEEDLE) ×2 IMPLANT
NEEDLE SPNL 22GX3.5 QUINCKE BK (NEEDLE) ×3 IMPLANT
SCISSORS LAP 5X35 DISP (ENDOMECHANICALS) ×3 IMPLANT
SET IRRIG TUBING LAPAROSCOPIC (IRRIGATION / IRRIGATOR) ×1 IMPLANT
SHEARS HARMONIC ACE PLUS 36CM (ENDOMECHANICALS) IMPLANT
SLEEVE XCEL OPT CAN 5 100 (ENDOMECHANICALS) ×3 IMPLANT
SOLUTION ANTI FOG 6CC (MISCELLANEOUS) ×2 IMPLANT
STRIP CLOSURE SKIN 1/2X4 (GAUZE/BANDAGES/DRESSINGS) ×3 IMPLANT
SUT MNCRL AB 4-0 PS2 18 (SUTURE) ×4 IMPLANT
SUT NOVA NAB DX-16 0-1 5-0 T12 (SUTURE) IMPLANT
SUT NOVA NAB GS-21 0 18 T12 DT (SUTURE) ×1 IMPLANT
SUT PROLENE 0 CT 1 CR/8 (SUTURE) IMPLANT
TACKER 5MM HERNIA 3.5CML NAB (ENDOMECHANICALS) ×1 IMPLANT
TOWEL OR 17X26 10 PK STRL BLUE (TOWEL DISPOSABLE) ×3 IMPLANT
TRAY FOLEY W/METER SILVER 14FR (SET/KITS/TRAYS/PACK) ×3 IMPLANT
TRAY LAPAROSCOPIC (CUSTOM PROCEDURE TRAY) ×3 IMPLANT
TROCAR BLADELESS OPT 5 100 (ENDOMECHANICALS) ×3 IMPLANT
TROCAR XCEL BLUNT TIP 100MML (ENDOMECHANICALS) IMPLANT
TROCAR XCEL NON-BLD 11X100MML (ENDOMECHANICALS) ×1 IMPLANT
TUBING INSUFFLATION 10FT LAP (TUBING) ×3 IMPLANT

## 2015-04-15 NOTE — Interval H&P Note (Signed)
History and Physical Interval Note:  04/15/2015 8:44 AM  Savannah Wise  has presented today for surgery, with the diagnosis of VENTRAL HERNIA  The various methods of treatment have been discussed with the patient and family. After consideration of risks, benefits and other options for treatment, the patient has consented to  Procedure(s): LAPAROSCOPIC VENTRAL HERNIA REPAIR WITH MESH (N/A) INSERTION OF MESH (N/A) as a surgical intervention .  The patient's history has been reviewed, patient examined, no change in status, stable for surgery.  I have reviewed the patient's chart and labs.  Questions were answered to the patient's satisfaction.     Zauria Dombek Shela Commons

## 2015-04-15 NOTE — Anesthesia Procedure Notes (Signed)
Procedure Name: Intubation Date/Time: 04/15/2015 8:55 AM Performed by: Doran Clay Pre-anesthesia Checklist: Patient identified, Emergency Drugs available, Suction available, Patient being monitored and Timeout performed Patient Re-evaluated:Patient Re-evaluated prior to inductionOxygen Delivery Method: Circle system utilized Preoxygenation: Pre-oxygenation with 100% oxygen Intubation Type: IV induction Ventilation: Mask ventilation without difficulty Laryngoscope Size: Mac and 3 Grade View: Grade I Tube type: Oral Tube size: 7.0 mm Number of attempts: 1 Airway Equipment and Method: Stylet Placement Confirmation: ETT inserted through vocal cords under direct vision,  positive ETCO2 and CO2 detector Secured at: 22 cm Tube secured with: Tape Dental Injury: Teeth and Oropharynx as per pre-operative assessment

## 2015-04-15 NOTE — Anesthesia Postprocedure Evaluation (Signed)
  Anesthesia Post-op Note  Patient: Savannah Wise  Procedure(s) Performed: Procedure(s): LAPAROSCOPIC RECURRENT VENTRAL HERNIA REPAIR WITH MESH (N/A) INSERTION OF MESH (N/A) LAPAROSCOPIC LYSIS OF ADHESIONS (N/A)  Patient Location: PACU  Anesthesia Type:General  Level of Consciousness: awake  Airway and Oxygen Therapy: Patient Spontanous Breathing and Patient connected to nasal cannula oxygen  Post-op Pain: mild  Post-op Assessment: Post-op Vital signs reviewed, Patient's Cardiovascular Status Stable, Respiratory Function Stable, Patent Airway, No signs of Nausea or vomiting and Pain level controlled              Post-op Vital Signs: Reviewed and stable  Last Vitals:  Filed Vitals:   04/15/15 1530  BP: 128/71  Pulse: 94  Temp: 37.1 C  Resp: 18    Complications: No apparent anesthesia complications

## 2015-04-15 NOTE — Transfer of Care (Signed)
Immediate Anesthesia Transfer of Care Note  Patient: Savannah Wise  Procedure(s) Performed: Procedure(s): LAPAROSCOPIC RECURRENT VENTRAL HERNIA REPAIR WITH MESH (N/A) INSERTION OF MESH (N/A) LAPAROSCOPIC LYSIS OF ADHESIONS (N/A)  Patient Location: PACU  Anesthesia Type:General  Level of Consciousness:  sedated, patient cooperative and responds to stimulation  Airway & Oxygen Therapy:Patient Spontanous Breathing and Patient connected to face mask oxgen  Post-op Assessment:  Report given to PACU RN and Post -op Vital signs reviewed and stable  Post vital signs:  Reviewed and stable  Last Vitals:  Filed Vitals:   04/15/15 0702  BP: 135/91  Pulse: 94  Temp: 37.1 C  Resp: 20    Complications: No apparent anesthesia complications

## 2015-04-15 NOTE — Discharge Instructions (Signed)
[facility] _______[facility]  ABDOMINAL HERNIA REPAIR: POST OP INSTRUCTIONS  Always review your discharge instruction sheet given to you by the facility where your surgery was performed. IF YOU HAVE DISABILITY OR FAMILY LEAVE FORMS, YOU MUST BRING THEM TO THE OFFICE FOR PROCESSING.   DO NOT GIVE THEM TO YOUR DOCTOR.  1. A  prescription for pain medication may be given to you upon discharge.  Take your pain medication as prescribed, if needed.  If narcotic pain medicine is not needed, then you may take acetaminophen (Tylenol) or ibuprofen (Advil) as needed. 2. Take your usually prescribed medications unless otherwise directed. 3. If you need a refill on your pain medication, please contact your pharmacy.  They will contact our office to request authorization. Prescriptions will not be filled after 5 pm or on week-ends. 4. You should follow a light diet the first 24 hours after arrival home, such as soup and crackers, etc.  Be sure to include lots of fluids daily.  Resume your normal diet the day after surgery. 5. Most patients will experience some swelling and bruising around the umbilicus or in the groin and scrotum.  Ice packs and reclining will help.  Swelling and bruising can take several days to resolve.  6. It is common to experience some constipation if taking pain medication after surgery.  Increasing fluid intake and taking a stool softener (such as Colace) will usually help or prevent this problem from occurring.  A mild laxative (Milk of Magnesia or Miralax) should be taken according to package directions if there are no bowel movements after 48 hours. 7. Unless discharge instructions indicate otherwise, you may remove your bandages 72 hours after surgery, and you may shower at that time.  You may have steri-strips (small skin tapes) in place directly over the incision.  These strips should be left on the skin.  If your surgeon used skin glue on the incision, you may shower in 24  hours.  The glue will flake off over the next 2-3 weeks.  Any sutures or staples will be removed at the office during your follow-up visit. 8. ACTIVITIES:  You may resume regular (light) daily activities beginning the next day--such as daily self-care, walking, climbing stairs--gradually increasing activities as tolerated.  You may have sexual intercourse when it is comfortable.  Refrain from any heavy lifting or straining-nothing over 10 pounds for 6 weeks.  a. You may drive when you are no longer taking prescription pain medication, you can comfortably wear a seatbelt, and you can safely maneuver your car and apply brakes. b. RETURN TO WORK:  __________________________________________________________ 9. You should see your doctor in the office for a follow-up appointment approximately 2-3 weeks after your surgery.  Make sure that you call for this appointment within a day or two after you arrive home to insure a convenient appointment time. 10. OTHER INSTRUCTIONS:  __________________________________________________________________________________________________________________________________________________________________________________________  WHEN TO CALL YOUR DOCTOR: 1. Fever over 101.0 2. Inability to urinate 3. Nausea and/or vomiting 4. Extreme swelling or bruising 5. Continued bleeding from incision. 6. Increased pain, redness, or drainage from the incision  The clinic staff is available to answer your questions during regular business hours.  Please dont hesitate to call and ask to speak to one of the nurses for clinical concerns.  If you have a medical emergency, go to the nearest emergency room or call 911.  A surgeon from [facility] is always on call at the hospital   [address]  [address] [place], [place], [place]  [address] ?  [address], [place], [place]   [address] [phone] ? [phone] ? [phone] [phone] Web site: [email or web address]

## 2015-04-15 NOTE — Anesthesia Preprocedure Evaluation (Signed)
Anesthesia Evaluation  Patient identified by MRN, date of birth, ID band Patient awake    Reviewed: Allergy & Precautions, NPO status , Patient's Chart, lab work & pertinent test results  History of Anesthesia Complications Negative for: history of anesthetic complications  Airway Mallampati: IV  TM Distance: >3 FB Neck ROM: Full    Dental  (+) Teeth Intact   Pulmonary neg shortness of breath, neg sleep apnea, neg COPDneg recent URI,  breath sounds clear to auscultation        Cardiovascular hypertension, Pt. on medications - angina- Past MI and - CHF Rhythm:Regular     Neuro/Psych  Headaches, negative psych ROS   GI/Hepatic negative GI ROS, Neg liver ROS,   Endo/Other  diabetes, Type 2, Oral Hypoglycemic AgentsMorbid obesity  Renal/GU negative Renal ROS     Musculoskeletal   Abdominal   Peds  Hematology negative hematology ROS (+)   Anesthesia Other Findings   Reproductive/Obstetrics                             Anesthesia Physical Anesthesia Plan  ASA: II  Anesthesia Plan: General   Post-op Pain Management:    Induction: Intravenous  Airway Management Planned: Oral ETT  Additional Equipment: None  Intra-op Plan:   Post-operative Plan: Extubation in OR  Informed Consent: I have reviewed the patients History and Physical, chart, labs and discussed the procedure including the risks, benefits and alternatives for the proposed anesthesia with the patient or authorized representative who has indicated his/her understanding and acceptance.   Dental advisory given  Plan Discussed with: CRNA and Surgeon  Anesthesia Plan Comments:         Anesthesia Quick Evaluation

## 2015-04-15 NOTE — H&P (View-Only) (Signed)
Laurynn Falzon 03/30/2015 4:50 PM Location: [facility] Patient #: [id number] DOB: 11/12/1970 Married / Language: English / Race: Refused to Report/Unreported Female History of Present Illness (Lerone Onder J. Rechel Delosreyes MD; 03/30/2015 5:24 PM) Patient words: discuss hernia.  The patient is a 44 year old female    Note:She presents today for preoperative visit to discuss repair of recurrent ventral hernia containing fat. This is verified by CT scan. She recently got back into the country from [place]. The hernia is still symptomatic. She is interested in proceeding with repair. Her husband is here with her. She'll be in the country until the end of August but can't stay longer if needed.  Allergies (Chemira Jones; 03/30/2015 5:03 PM) Penicillamine *ASSORTED CLASSES*  Medication History (Chemira Jones; 03/30/2015 5:03 PM) Aspirin EC (81MG Tablet DR, Oral) Active. Glimepiride (4MG Tablet, Oral) Active. Invokana (100MG Tablet, Oral) Active. MetFORMIN HCl (1000MG Tablet, Oral) Active. Diovan HCT (160-12.5MG Tablet, Oral) Active. Medications Reconciled    Vitals (Chemira Jones; 03/30/2015 5:01 PM) 03/30/2015 4:50 PM Weight: 228.25 lb Height: 64in Body Surface Area: 2.16 m Body Mass Index: 39.18 kg/m Temp.: 99.2F(Oral)  Pulse: 78 (Regular)  BP: 108/72 (Sitting, Left Arm, Standard)     Physical Exam (Beronica Lansdale J. Chrishawn Boley MD; 03/30/2015 5:25 PM)  The physical exam findings are as follows: Note:General-overweight female in no acute distress.  Cardiovascular-regular rate and rhythm.  Abdomen-soft and obese, partially reducible supraumbilical bulge, subumbilical scar.    Assessment & Plan (Eli Adami J. Grayton Lobo MD; 03/30/2015 5:25 PM)  RECURRENT VENTRAL HERNIA (553.21  K43.2) Impression: This contains fat. It is larger today than it was when I saw her previously. It remains symptomatic. She would like to proceed with repair.  Plan: Laparoscopic repair of recurrent  ventral hernia with mesh. I have discussed the procedure, risks, and aftercare. Risks include but are not limited to bleeding, infection, wound healing problems, anesthesia, recurrence, accidental injury to intra-abdominal organs-such as intestine, liver, spleen, bladder, etc. We also discussed the rare complication of mesh rejection. All questions were answered.  Damontre Millea, MD 

## 2015-04-15 NOTE — Op Note (Signed)
Operative Note  Savannah Wise female 44 y.o. 04/15/2015  PREOPERATIVE DX:  Recurrent ventral hernia  POSTOPERATIVE DX:  Same  PROCEDURE:   Laparoscopic lysis of adhesions for 30 minutes. Laparoscopic repair of recurrent ventral hernia with mesh.         Surgeon: Adolph Pollack   Assistants: Bevely Palmer, PA student  Anesthesia: General endotracheal anesthesia  Indications:   This is a 44 year old female with a previous umbilical hernia repair many years ago. She has an uncomfortable supraumbilical bulge. CT scan shows a recurrent ventral hernia containing omentum. She now presents for elective repair.    Procedure Detail:  She was brought to the operating room placed supine on the operating table in the general anesthetic was given. A Foley catheter was inserted. The abdominal wall was widely sterilely prepped and draped.  She is placed in slight reversed Trendelenburg position. A 5 mm incision was made in the left subcostal area. Using a 5 mm Optiview trocar and laparoscope, access was gained into the peritoneal cavity and a pneumoperitoneum was created. Laparoscope was introduced and there is no underlying organ injury or bleeding. I noted omental adhesions to the midline abdominal wall.  Using sharp and blunt dissection, the adhesions were separated from the abdominal wall and from the previously used mesh. This took approximately 30 minutes. The recurrent hernia was evident. The edges of the recurrent hernia were identified using a spinal needle than 4 cm marked away from all of these edges in a circumferential fashion.  A piece of 20 cm x 15 cm Ventralight mesh was brought onto the field and cut to appropriate size to allow for 4 cm of overlap.  Anchoring sutures of 0 Novafil were placed at the 12, 3, 6, and 9:00 positions. 4 stab wounds were made and the abdominal wall at the same positions. The mesh was hydrated. An 11 mm trocar was then placed in the right upper quadrant. The  mesh was then placed into the abdominal cavity through the 11 mm trocar and deployed. The nonadherent barrier side of the mesh faced the viscera, the rough side faced the abdominal wall.  The 4 anchoring sutures were pulled up across the transverse fascial bridge and tied down initially anchoring the mesh to the abdominal wall. Using a spiral tacker, the edges the mesh were further anchored to the abdominal wall. Inner tacks were used as well. This allowed for good coverage with adequate overlap of the recurrent hernia.  I then did a abdominal inspection and there is no evidence of bleeding or organ injury. CO2 gas was released and I watched the omentum approximate the mesh. Trocars were removed.  Trocar site skin incisions were closed with 4-0 Monocryl subcuticular stitches followed by Steri-Strips and sterile dressings.  She tolerated the procedure well without a pair of complications and was taken to the recovery room in satisfactory condition.  Estimated Blood Loss:  100 ml               Complications:  * No complications entered in OR log *         Disposition: PACU - hemodynamically stable.         Condition: stable

## 2015-04-16 ENCOUNTER — Encounter (HOSPITAL_COMMUNITY): Payer: Self-pay | Admitting: General Surgery

## 2015-04-16 DIAGNOSIS — K432 Incisional hernia without obstruction or gangrene: Secondary | ICD-10-CM | POA: Diagnosis not present

## 2015-04-16 LAB — GLUCOSE, CAPILLARY: GLUCOSE-CAPILLARY: 171 mg/dL — AB (ref 65–99)

## 2015-04-16 MED ORDER — ONDANSETRON HCL 4 MG PO TABS: 4.0000 mg | ORAL_TABLET | ORAL | Status: DC | PRN

## 2015-04-16 MED ORDER — OXYCODONE HCL 5 MG PO TABS: 5.0000 mg | ORAL_TABLET | ORAL | Status: DC | PRN

## 2015-04-16 NOTE — Progress Notes (Signed)
Discharge instructions and prescriptions given to patient .  Questions answered 

## 2015-04-16 NOTE — Progress Notes (Signed)
1 Day Post-Op  Subjective: Sore around umbilical area  Objective: Vital signs in last 24 hours: Temp:  [98 F (36.7 C)-99.1 F (37.3 C)] 98.8 F (37.1 C) (07/22 0600) Pulse Rate:  [76-99] 76 (07/22 0600) Resp:  [18-26] 20 (07/22 0600) BP: (91-154)/(56-89) 91/66 mmHg (07/22 0600) SpO2:  [93 %-99 %] 95 % (07/22 0600) Last BM Date: 04/15/15  Intake/Output from previous day: 07/21 0701 - 07/22 0700 In: 3355 [P.O.:120; I.V.:3235] Out: 1800 [Urine:1800] Intake/Output this shift:    PE: General- In NAD Abdomen-soft, dressings dry  Lab Results:  No results for input(s): WBC, HGB, HCT, PLT in the last 72 hours. BMET No results for input(s): NA, K, CL, CO2, GLUCOSE, BUN, CREATININE, CALCIUM in the last 72 hours. PT/INR No results for input(s): LABPROT, INR in the last 72 hours. Comprehensive Metabolic Panel:    Component Value Date/Time   NA 137 04/12/2015 1105   NA 135 04/02/2015 1416   K 4.0 04/12/2015 1105   K 4.1 04/02/2015 1416   CL 103 04/12/2015 1105   CL 100 04/02/2015 1416   CO2 25 04/12/2015 1105   CO2 26 04/02/2015 1416   BUN 17 04/12/2015 1105   BUN 16 04/02/2015 1416   CREATININE 0.58 04/12/2015 1105   CREATININE 0.50 04/02/2015 1416   GLUCOSE 162* 04/12/2015 1105   GLUCOSE 157* 04/02/2015 1416   CALCIUM 8.7* 04/12/2015 1105   CALCIUM 9.3 04/02/2015 1416   AST 14* 04/12/2015 1105   AST 12 04/05/2015 1431   ALT 18 04/12/2015 1105   ALT 16 04/05/2015 1431   ALKPHOS 58 04/12/2015 1105   ALKPHOS 61 04/05/2015 1431   BILITOT 0.2* 04/12/2015 1105   BILITOT 0.4 04/05/2015 1431   PROT 7.4 04/12/2015 1105   PROT 7.5 04/05/2015 1431   ALBUMIN 3.9 04/12/2015 1105   ALBUMIN 4.0 04/05/2015 1431     Studies/Results: No results found.  Anti-infectives: Anti-infectives    Start     Dose/Rate Route Frequency Ordered Stop   04/15/15 0715  ceFAZolin (ANCEF) IVPB 2 g/50 mL premix     2 g 100 mL/hr over 30 Minutes Intravenous On call to O.R. 04/15/15 1478  04/15/15 0905      Assessment Principal Problem:   Recurrent ventral hernia s/p laparoscopic repair with mesh-doing well      Plan:  Discharge.  Instructions given to her and her husband.   Lorenda Grecco Shela Commons 04/16/2015

## 2015-04-20 NOTE — Discharge Summary (Signed)
Physician Discharge Summary  Patient ID: Savannah Wise MRN: [id number] DOB/AGE: 03-12-1971 43 y.o.  Admit date: 04/15/2015 Discharge date: 04/16/2015  Admission Diagnoses:  Recurrent Ventral hernia  Discharge Diagnoses:  Principal Problem:   Recurrent ventral hernia s/p laparoscopic repair with mesh Morbid obesity Type 2 diabetes mellitus  Discharged Condition: good  Hospital Course: She underwent the above operation and tolerated it well. She was able to be discharged on her first postoperative day. Discharge instructions were given to her and her husband.   Discharge Exam: Blood pressure 135/72, pulse 78, temperature 98.9 F (37.2 C), temperature source Oral, resp. rate 18, height  (1.626 m), weight 102.513 kg (226 lb), last menstrual period 04/09/2015, SpO2 92 %.   Disposition: 01-Home or Self Care     Medication List    STOP taking these medications        aspirin 81 MG tablet      TAKE these medications        acetaminophen 325 MG tablet  Commonly known as:  TYLENOL  Take 650 mg by mouth every 6 (six) hours as needed.     ALEVE PO  Take 2 tablets by mouth as needed.     glimepiride 4 MG tablet  Commonly known as:  AMARYL  TAKE ONE TABLET BY MOUTH ONCE DAILY BEFORE BREAKFAST.     INVOKANA 100 MG Tabs tablet  Generic drug:  canagliflozin  Take 100 mg by mouth daily.     metFORMIN 1000 MG tablet  Commonly known as:  GLUCOPHAGE  Take 1 tablet (1,000 mg total) by mouth 2 (two) times daily with a meal.     ondansetron 4 MG tablet  Commonly known as:  ZOFRAN  Take 1 tablet (4 mg total) by mouth every 4 (four) hours as needed for nausea.     oxyCODONE 5 MG immediate release tablet  Commonly known as:  Oxy IR/ROXICODONE  Take 1-2 tablets (5-10 mg total) by mouth every 4 (four) hours as needed for moderate pain, severe pain or breakthrough pain.     valsartan-hydrochlorothiazide 160-12.5 MG per tablet  Commonly known as:  DIOVAN-HCT  TAKE ONE TABLET BY  MOUTH ONCE DAILY         Signed: Muadh Creasy J 04/20/2015, 10:52 AM

## 2015-04-28 ENCOUNTER — Ambulatory Visit: Payer: BLUE CROSS/BLUE SHIELD | Admitting: Family Medicine

## 2015-05-10 ENCOUNTER — Ambulatory Visit (INDEPENDENT_AMBULATORY_CARE_PROVIDER_SITE_OTHER): Payer: BLUE CROSS/BLUE SHIELD | Admitting: Family Medicine

## 2015-05-10 ENCOUNTER — Encounter: Payer: Self-pay | Admitting: Family Medicine

## 2015-05-10 VITALS — BP 110/80 | HR 80 | Temp 98.5°F | Wt 225.0 lb

## 2015-05-10 DIAGNOSIS — R5383 Other fatigue: Secondary | ICD-10-CM | POA: Diagnosis not present

## 2015-05-10 DIAGNOSIS — E119 Type 2 diabetes mellitus without complications: Secondary | ICD-10-CM | POA: Diagnosis not present

## 2015-05-10 DIAGNOSIS — D563 Thalassemia minor: Secondary | ICD-10-CM | POA: Insufficient documentation

## 2015-05-10 DIAGNOSIS — E1165 Type 2 diabetes mellitus with hyperglycemia: Secondary | ICD-10-CM

## 2015-05-10 LAB — CBC WITH DIFFERENTIAL/PLATELET
BASOS ABS: 0 10*3/uL (ref 0.0–0.1)
Basophils Relative: 0.4 % (ref 0.0–3.0)
EOS ABS: 0.2 10*3/uL (ref 0.0–0.7)
Eosinophils Relative: 1.9 % (ref 0.0–5.0)
HCT: 40 % (ref 36.0–46.0)
Hemoglobin: 11.8 g/dL — ABNORMAL LOW (ref 12.0–15.0)
LYMPHS ABS: 4 10*3/uL (ref 0.7–4.0)
Lymphocytes Relative: 34.4 % (ref 12.0–46.0)
MCHC: 29.6 g/dL — ABNORMAL LOW (ref 30.0–36.0)
Monocytes Absolute: 0.5 10*3/uL (ref 0.1–1.0)
Monocytes Relative: 4 % (ref 3.0–12.0)
NEUTROS ABS: 6.9 10*3/uL (ref 1.4–7.7)
NEUTROS PCT: 59.3 % (ref 43.0–77.0)
PLATELETS: 415 10*3/uL — AB (ref 150.0–400.0)
RBC: 6.15 Mil/uL — AB (ref 3.87–5.11)
RDW: 14.9 % (ref 11.5–15.5)
WBC: 11.7 10*3/uL — ABNORMAL HIGH (ref 4.0–10.5)

## 2015-05-10 LAB — BASIC METABOLIC PANEL
BUN: 17 mg/dL (ref 6–23)
CO2: 25 meq/L (ref 19–32)
CREATININE: 0.47 mg/dL (ref 0.40–1.20)
Calcium: 9.2 mg/dL (ref 8.4–10.5)
Chloride: 102 mEq/L (ref 96–112)
GFR: 151.83 mL/min (ref >60.00)
Glucose, Bld: 150 mg/dL — ABNORMAL HIGH (ref 70–99)
POTASSIUM: 4 meq/L (ref 3.5–5.1)
SODIUM: 137 meq/L (ref 135–145)

## 2015-05-10 NOTE — Progress Notes (Signed)
Subjective:    Patient ID: Savannah Wise, female    DOB: 01/31/1971, 44 y.o.   MRN: [id number]  HPI Patient seen with nonspecific symptoms of fatigue and mild body aches and generalized weakness over the past few weeks. She had ventral hernia repair with mesh 3 weeks ago and states she has been more fatigued since then. Her appetite has been okay. She has, in general, poor sleep quality. She has history of chronic low-grade anemia and reportedly has thalassemia minor. Preoperative hemoglobin 11.2. She does occasionally have lightheaded feelings when she stands up.denies any recent fevers or chills. She is recovering well from her surgery with no abdominal pain  Type 2 diabetes with recent sub-optimal control. We increased her Invokana to 300 mg daily. She denies any symptoms of polyuria or polydipsia. No dysuria.  Past Medical History  Diagnosis Date  . Hypertension   . Exogenous obesity     mild  . Hypertriglyceridemia   . Diabetes mellitus without complication     type 2 dx 2013  . Shortness of breath dyspnea     with walking-EKG and check up done  . HA (headache)     every morning  . Anemia     mild  . Dizzy spells    Past Surgical History  Procedure Laterality Date  . Hernia repair  2006    abdominal  . Breast surgery Left     breast biopsy 2010  . Ventral hernia repair N/A 04/15/2015    Procedure: LAPAROSCOPIC RECURRENT VENTRAL HERNIA REPAIR WITH MESH;  Surgeon: Avel Peace, MD;  Location: [facility];  Service: General;  Laterality: N/A;  . Insertion of mesh N/A 04/15/2015    Procedure: INSERTION OF MESH;  Surgeon: Avel Peace, MD;  Location: [facility];  Service: General;  Laterality: N/A;  . Laparoscopic lysis of adhesions N/A 04/15/2015    Procedure: LAPAROSCOPIC LYSIS OF ADHESIONS;  Surgeon: Avel Peace, MD;  Location: [facility];  Service: General;  Laterality: N/A;    reports that she has never smoked. She has never used smokeless tobacco. She reports that she does not  drink alcohol or use illicit drugs. family history includes Arthritis in her father; Diabetes in her father and mother; Heart disease in her father and mother; Hyperlipidemia in her father and mother; Hypertension in her father and mother. Allergies  Allergen Reactions  . Iodinated Diagnostic Agents Hives and Rash    S/P 10 MIN AFTER RECEIVING CONTRAST DEVELOPED HIVES UPPER CHEST /NECK AND LEGS , PATIENT RECEIVED 50 MG BENADRYL/MMS  . Omnipaque [Iohexol] Hives, Itching and Swelling  . Penicillins     History of family allergy only- loss of hearing      Review of Systems  Constitutional: Positive for fatigue. Negative for fever, chills and unexpected weight change.  Respiratory: Negative for cough and shortness of breath.   Cardiovascular: Negative for chest pain, palpitations and leg swelling.  Gastrointestinal: Negative for nausea, vomiting, abdominal pain and diarrhea.  Genitourinary: Negative for dysuria.  Neurological: Positive for weakness. Negative for dizziness and syncope.       Objective:   Physical Exam  Constitutional: She appears well-developed and well-nourished. No distress.  HENT:  Mouth/Throat: Oropharynx is clear and moist.  Neck: Neck supple.  Cardiovascular: Normal rate and regular rhythm.   Pulmonary/Chest: Effort normal and breath sounds normal. No respiratory distress. She has no wheezes. She has no rales.  Abdominal: Soft. She exhibits no mass. There is no tenderness.  Incision sites appear  to be healing well with no erythema or signs of infection  Musculoskeletal: She exhibits no edema.  Lymphadenopathy:    She has no cervical adenopathy.  Skin: No rash noted.          Assessment & Plan:  Nonspecific malaise and fatigue following her recent surgery. Relatively non-focal exam. She does have history of chronic low-grade anemia probably secondary to thalassemia minor. Recheck CBC and basic metabolic panel.  Recent TSH was normal. She is encouraged to  lose some weight  Poorly controlled type 2 diabetes but overall improved. Strongly encouraged to lose weight. Recheck A1c at follow-up

## 2015-05-11 ENCOUNTER — Ambulatory Visit: Payer: Self-pay

## 2015-09-21 LAB — HM DIABETES EYE EXAM

## 2015-09-22 ENCOUNTER — Ambulatory Visit
Admission: RE | Admit: 2015-09-22 | Discharge: 2015-09-22 | Disposition: A | Discharge status: Home / Self Care | Payer: BLUE CROSS/BLUE SHIELD | Source: Ambulatory Visit

## 2015-09-22 DIAGNOSIS — Z1231 Encounter for screening mammogram for malignant neoplasm of breast: Secondary | ICD-10-CM

## 2015-09-23 ENCOUNTER — Ambulatory Visit: Payer: BLUE CROSS/BLUE SHIELD | Admitting: Family Medicine

## 2015-09-23 ENCOUNTER — Encounter: Payer: Self-pay | Admitting: Family Medicine

## 2015-09-23 ENCOUNTER — Ambulatory Visit (INDEPENDENT_AMBULATORY_CARE_PROVIDER_SITE_OTHER): Payer: BLUE CROSS/BLUE SHIELD | Admitting: Family Medicine

## 2015-09-23 VITALS — BP 96/70 | HR 92 | Temp 98.8°F | Ht 64.0 in | Wt 231.9 lb

## 2015-09-23 DIAGNOSIS — E669 Obesity, unspecified: Secondary | ICD-10-CM

## 2015-09-23 DIAGNOSIS — E1165 Type 2 diabetes mellitus with hyperglycemia: Secondary | ICD-10-CM | POA: Diagnosis not present

## 2015-09-23 DIAGNOSIS — R5383 Other fatigue: Secondary | ICD-10-CM | POA: Diagnosis not present

## 2015-09-23 DIAGNOSIS — E781 Pure hyperglyceridemia: Secondary | ICD-10-CM | POA: Diagnosis not present

## 2015-09-23 DIAGNOSIS — I119 Hypertensive heart disease without heart failure: Secondary | ICD-10-CM | POA: Diagnosis not present

## 2015-09-23 DIAGNOSIS — E6609 Other obesity due to excess calories: Secondary | ICD-10-CM

## 2015-09-23 LAB — HEPATIC FUNCTION PANEL
ALBUMIN: 4 g/dL (ref 3.5–5.2)
ALK PHOS: 60 U/L (ref 39–117)
ALT: 15 U/L (ref 0–35)
AST: 10 U/L (ref 0–37)
Bilirubin, Direct: 0.1 mg/dL (ref 0.0–0.3)
Total Bilirubin: 0.3 mg/dL (ref 0.2–1.2)
Total Protein: 6.9 g/dL (ref 6.0–8.3)

## 2015-09-23 LAB — BASIC METABOLIC PANEL
BUN: 14 mg/dL (ref 6–23)
CO2: 25 mEq/L (ref 19–32)
Calcium: 9.5 mg/dL (ref 8.4–10.5)
Chloride: 102 mEq/L (ref 96–112)
Creatinine, Ser: 0.42 mg/dL (ref 0.40–1.20)
GFR: 172.59 mL/min (ref >60.00)
GLUCOSE: 102 mg/dL — AB (ref 70–99)
POTASSIUM: 4.7 meq/L (ref 3.5–5.1)
Sodium: 136 mEq/L (ref 135–145)

## 2015-09-23 LAB — LIPID PANEL
Cholesterol: 133 mg/dL (ref 0–200)
HDL: 37.5 mg/dL — AB (ref >39.00)
LDL Cholesterol: 64 mg/dL (ref 0–99)
NONHDL: 95.17
Total CHOL/HDL Ratio: 4
Triglycerides: 158 mg/dL — ABNORMAL HIGH (ref 0.0–149.0)
VLDL: 31.6 mg/dL (ref 0.0–40.0)

## 2015-09-23 LAB — HEMOGLOBIN A1C: HEMOGLOBIN A1C: 8.3 % — AB (ref 4.6–6.5)

## 2015-09-23 LAB — VITAMIN D 25 HYDROXY (VIT D DEFICIENCY, FRACTURES): VITD: 12.54 ng/mL — ABNORMAL LOW (ref 30.00–100.00)

## 2015-09-23 NOTE — Progress Notes (Signed)
Pre visit review using our clinic review tool, if applicable. No additional management support is needed unless otherwise documented below in the visit note. 

## 2015-09-23 NOTE — Progress Notes (Signed)
Subjective:    Patient ID: Savannah Wise, female    DOB: 10-Dec-1970, 44 y.o.   MRN: [id number]  HPI Medical follow-up  Type 2 diabetes. History of poor control. She has unfortunately gained a few pounds since last visit. We increased her invokana to 300 mg daily. She also remains on metformin and glimepiride. No recent hypoglycemia. No polyuria or polydipsia. Inconsistent exercise  Hypertension treated with valsartan- HCTZ. Stable. No headaches. No chest pains. No dizziness.  History of mild dyslipidemia with elevated triglycerides. Not currently on statin. Requesting repeat lipid panel.  She's had some fatigue issues but states she feels better than last summer. Vitamin D levels have not been checked.  Recent TSH normal.  Past Medical History  Diagnosis Date  . Hypertension   . Exogenous obesity     mild  . Hypertriglyceridemia   . Diabetes mellitus without complication (HCC)     type 2 dx 2013  . Shortness of breath dyspnea     with walking-EKG and check up done  . HA (headache)     every morning  . Anemia     mild  . Dizzy spells    Past Surgical History  Procedure Laterality Date  . Hernia repair  2006    abdominal  . Breast surgery Left     breast biopsy 2010  . Ventral hernia repair N/A 04/15/2015    Procedure: LAPAROSCOPIC RECURRENT VENTRAL HERNIA REPAIR WITH MESH;  Surgeon: Avel Peace, MD;  Location: [facility];  Service: General;  Laterality: N/A;  . Insertion of mesh N/A 04/15/2015    Procedure: INSERTION OF MESH;  Surgeon: Avel Peace, MD;  Location: [facility];  Service: General;  Laterality: N/A;  . Laparoscopic lysis of adhesions N/A 04/15/2015    Procedure: LAPAROSCOPIC LYSIS OF ADHESIONS;  Surgeon: Avel Peace, MD;  Location: [facility];  Service: General;  Laterality: N/A;    reports that she has never smoked. She has never used smokeless tobacco. She reports that she does not drink alcohol or use illicit drugs. family history includes Arthritis in her  father; Diabetes in her father and mother; Heart disease in her father and mother; Hyperlipidemia in her father and mother; Hypertension in her father and mother. Allergies  Allergen Reactions  . Iodinated Diagnostic Agents Hives and Rash    S/P 10 MIN AFTER RECEIVING CONTRAST DEVELOPED HIVES UPPER CHEST /NECK AND LEGS , PATIENT RECEIVED 50 MG BENADRYL/MMS  . Omnipaque [Iohexol] Hives, Itching and Swelling  . Penicillins     History of family allergy only- loss of hearing      Review of Systems  Constitutional: Positive for fatigue. Negative for appetite change and unexpected weight change.  Eyes: Negative for visual disturbance.  Respiratory: Negative for cough, chest tightness, shortness of breath and wheezing.   Cardiovascular: Negative for chest pain, palpitations and leg swelling.  Neurological: Negative for dizziness, seizures, syncope, weakness, light-headedness and headaches.       Objective:   Physical Exam  Constitutional: She appears well-developed and well-nourished.  Eyes: Pupils are equal, round, and reactive to light.  Neck: Neck supple. No JVD present.  Cardiovascular: Normal rate and regular rhythm.  Exam reveals no gallop.   Pulmonary/Chest: Effort normal and breath sounds normal. No respiratory distress. She has no wheezes. She has no rales.  Musculoskeletal: She exhibits no edema.  Neurological: She is alert.  Skin:  Feet reveal no skin lesions. Good distal foot pulses. Good capillary refill. No calluses. Normal sensation  with monofilament testing            Assessment & Plan:  #1 type 2 diabetes. History of poor control. History of poor compliance. Recheck A1c. Consider GLP-1 class is still elevated. She is strongly encouraged to lose some weight  #2 hypertension stable. Continue valsartan HCTZ. Check basic metabolic panel  #3 history of dyslipidemia. Patient requesting repeat lipid panel. Goal LDL less than 100  #4 history of fatigue. Somewhat  improved. Recent TSH normal. Thalassemia minor with recent stable hemoglobin. No daytime somnolence. Check 25-hydroxy vitamin D level

## 2015-09-24 MED ORDER — VITAMIN D (ERGOCALCIFEROL) 1.25 MG (50000 UNIT) PO CAPS: 50000.0000 [IU] | ORAL_CAPSULE | ORAL | Status: DC

## 2015-09-24 NOTE — Addendum Note (Signed)
Addended by: Tempie HoistMCNEIL, AUTUMN M on: 09/24/2015 05:07 PM   Modules accepted: Orders

## 2015-09-29 ENCOUNTER — Other Ambulatory Visit (INDEPENDENT_AMBULATORY_CARE_PROVIDER_SITE_OTHER): Payer: BLUE CROSS/BLUE SHIELD

## 2015-09-29 ENCOUNTER — Other Ambulatory Visit: Payer: Self-pay

## 2015-09-29 ENCOUNTER — Ambulatory Visit (INDEPENDENT_AMBULATORY_CARE_PROVIDER_SITE_OTHER): Payer: BLUE CROSS/BLUE SHIELD | Admitting: Cardiology

## 2015-09-29 ENCOUNTER — Other Ambulatory Visit: Payer: Self-pay | Admitting: General Surgery

## 2015-09-29 ENCOUNTER — Encounter: Payer: Self-pay | Admitting: Cardiology

## 2015-09-29 VITALS — BP 128/84 | HR 100 | Ht 64.0 in | Wt 232.8 lb

## 2015-09-29 DIAGNOSIS — I119 Hypertensive heart disease without heart failure: Secondary | ICD-10-CM | POA: Diagnosis not present

## 2015-09-29 DIAGNOSIS — R635 Abnormal weight gain: Secondary | ICD-10-CM | POA: Diagnosis not present

## 2015-09-29 DIAGNOSIS — E781 Pure hyperglyceridemia: Secondary | ICD-10-CM

## 2015-09-29 DIAGNOSIS — R1084 Generalized abdominal pain: Secondary | ICD-10-CM

## 2015-09-29 MED ORDER — INVOKANA 300 MG PO TABS: 300.0000 mg | ORAL_TABLET | Freq: Every day | ORAL | Status: DC

## 2015-09-29 NOTE — Patient Instructions (Signed)
Medication Instructions:  Your physician recommends that you continue on your current medications as directed. Please refer to the Current Medication list given to you today.  Labwork: none  Testing/Procedures: none  Follow-Up: Your physician wants you to follow-up in: 6 month ov with Dr Rennis GoldenHilty You will receive a reminder letter in the mail two months in advance. If you don't receive a letter, please call our office to schedule the follow-up appointment.  If you need a refill on your cardiac medications before your next appointment, please call your pharmacy.

## 2015-09-29 NOTE — Telephone Encounter (Signed)
Rx request for Invokana 300 mg tablet- Take 1 tablet by mouth once daily before breakfast.  #30  [facility]  [facility] sent [facility]. Per last result note pt declined started Trulicity and will work on her diet.

## 2015-09-29 NOTE — Progress Notes (Signed)
Cardiology Office Note   Date:  09/29/2015   ID:  Savannah RummageHuda A Lever, DOB 12/22/1970, MRN [id number]  PCP:  Kristian CoveyBURCHETTE,BRUCE W, MD  Cardiologist: Cassell Clementhomas Ronald Vinsant MD  Chief Complaint  Patient presents with  . benign hypertensive disease    denies cp/sob/le edema      History of Present Illness: Savannah Wise is a 45 y.o. female who presents for a six-month follow-up office visit.  This pleasant 45 year old woman is seen for a six-month followup office visit. She has a past history of exogenous obesity and mild essential hypertension. She is a diabetic. Since last visit she has been feeling well.She has gained 7 pounds of weight since last visit. Her blood work nonetheless is improved. She has not been having any chest pain or shortness of breath. No palpitations dizziness or syncope. Her most recent hemoglobin A1c was still elevated at 8.  She has a history of anemia.  She has heavy periods and has seen Dr. Jennette KettleNeal, gynecologist.  She also has a history of thalassemia minor.  She was recently found to have a low vitamin D level of 12 and is now on vitamin D supplement.            Past Medical History  Diagnosis Date  . Hypertension   . Exogenous obesity     mild  . Hypertriglyceridemia   . Diabetes mellitus without complication (HCC)     type 2 dx 2013  . Shortness of breath dyspnea     with walking-EKG and check up done  . HA (headache)     every morning  . Anemia     mild  . Dizzy spells     Past Surgical History  Procedure Laterality Date  . Hernia repair  2006    abdominal  . Breast surgery Left     breast biopsy 2010  . Ventral hernia repair N/A 04/15/2015    Procedure: LAPAROSCOPIC RECURRENT VENTRAL HERNIA REPAIR WITH MESH;  Surgeon: Avel Peaceodd Rosenbower, MD;  Location: [facility];  Service: General;  Laterality: N/A;  . Insertion of mesh N/A 04/15/2015    Procedure: INSERTION OF MESH;  Surgeon: Avel Peaceodd Rosenbower, MD;  Location: [facility];  Service: General;  Laterality:  N/A;  . Laparoscopic lysis of adhesions N/A 04/15/2015    Procedure: LAPAROSCOPIC LYSIS OF ADHESIONS;  Surgeon: Avel Peaceodd Rosenbower, MD;  Location: [facility];  Service: General;  Laterality: N/A;     Current Outpatient Prescriptions  Medication Sig Dispense Refill  . aspirin EC 81 MG tablet Take 81 mg by mouth daily.    Marland Kitchen. glimepiride (AMARYL) 4 MG tablet TAKE ONE TABLET BY MOUTH ONCE DAILY BEFORE BREAKFAST. 90 tablet 3  . INVOKANA 300 MG TABS tablet Take 300 mg by mouth daily before breakfast.   0  . metFORMIN (GLUCOPHAGE) 1000 MG tablet Take 1 tablet (1,000 mg total) by mouth 2 (two) times daily with a meal. 180 tablet 3  . Naproxen Sodium (ALEVE PO) Take 2 tablets by mouth as needed (pain).     . valsartan-hydrochlorothiazide (DIOVAN-HCT) 160-12.5 MG per tablet TAKE ONE TABLET BY MOUTH ONCE DAILY 90 tablet 3  . Vitamin D, Ergocalciferol, (DRISDOL) 50000 units CAPS capsule Take 1 capsule (50,000 Units total) by mouth every 7 (seven) days. 30 capsule 0   No current facility-administered medications for this visit.    Allergies:   Iodinated diagnostic agents; Omnipaque; and Penicillins    Social History:  The patient  reports that she has never  smoked. She has never used smokeless tobacco. She reports that she does not drink alcohol or use illicit drugs.   Family History:  The patient's family history includes Arthritis in her father; Diabetes in her father and mother; Heart disease in her father and mother; Hyperlipidemia in her father and mother; Hypertension in her father and mother.    ROS:  Please see the history of present illness.   Otherwise, review of systems are positive for none.   All other systems are reviewed and negative.    PHYSICAL EXAM: VS:  BP 128/84 mmHg  Pulse 100  Ht 5\' 4"  (1.626 m)  Wt 232 lb 12.8 oz (105.597 kg)  BMI 39.94 kg/m2  SpO2 94% , BMI Body mass index is 39.94 kg/(m^2). GEN: Well nourished, well developed, in no acute distress HEENT: normal Neck: no JVD,  carotid bruits, or masses Cardiac: RRR; no murmurs, rubs, or gallops,no edema  Respiratory:  clear to auscultation bilaterally, normal work of breathing GI: soft, nontender, nondistended, + BS MS: no deformity or atrophy Skin: warm and dry, no rash Neuro:  Strength and sensation are intact Psych: euthymic mood, full affect   EKG:  EKG is not ordered today.    Recent Labs: 03/30/2015: TSH 3.09 05/10/2015: Hemoglobin 11.8*; Platelets 415.0* 09/23/2015: ALT 15; BUN 14; Creatinine, Ser 0.42; Potassium 4.7; Sodium 136    Lipid Panel    Component Value Date/Time   CHOL 133 09/23/2015 1343   TRIG 158.0* 09/23/2015 1343   HDL 37.50* 09/23/2015 1343   CHOLHDL 4 09/23/2015 1343   VLDL 31.6 09/23/2015 1343   LDLCALC 64 09/23/2015 1343   LDLDIRECT 63.9 04/15/2014 1029      Wt Readings from Last 3 Encounters:  09/29/15 232 lb 12.8 oz (105.597 kg)  09/23/15 231 lb 14.4 oz (105.189 kg)  05/10/15 225 lb (102.059 kg)        ASSESSMENT AND PLAN:   1. hypertensive heart disease without heart failure 2. diabetes mellitus type 2 3. exogenous obesity. 4. Allergy to iodinated contrast dye.  5.  History of microcytic anemia, history of thalassemia minor, and history of menorrhagia. 6.  Low vitamin D level being treated by PCP  Current medicines are reviewed at length with the patient today.  The patient does not have concerns regarding medicines.  The following changes have been made:  no change  Labs/ tests ordered today include:  No orders of the defined types were placed in this encounter.    Disposition: I have encouraged her to lose weight.  She will continue current medication.  She will return in 6 months for follow-up with Dr. Rennis Golden after my retirement.  Karie Schwalbe MD 09/29/2015 9:49 AM    [facility] [address] [place], [place], [place]  [address] Phone: [phone]; Fax: [phone]

## 2015-09-29 NOTE — Addendum Note (Signed)
Addended by: Madhavi Hamblen K on: 09/29/2015 08:47 AM   Modules accepted: Orders  

## 2015-09-30 ENCOUNTER — Ambulatory Visit
Admission: RE | Admit: 2015-09-30 | Discharge: 2015-09-30 | Disposition: A | Discharge status: Home / Self Care | Payer: BLUE CROSS/BLUE SHIELD | Source: Ambulatory Visit | Attending: General Surgery | Admitting: General Surgery

## 2015-09-30 ENCOUNTER — Encounter: Payer: Self-pay | Admitting: Family Medicine

## 2015-09-30 DIAGNOSIS — R1084 Generalized abdominal pain: Secondary | ICD-10-CM

## 2015-09-30 MED ORDER — IOPAMIDOL (ISOVUE-300) INJECTION 61%
100.0000 mL | Freq: Once | INTRAVENOUS | Status: AC | PRN
  Administered 2015-09-30: 100 mL via INTRAVENOUS

## 2016-02-29 ENCOUNTER — Encounter: Payer: Self-pay | Admitting: Internal Medicine

## 2016-02-29 ENCOUNTER — Telehealth: Payer: Self-pay | Admitting: Internal Medicine

## 2016-03-03 NOTE — Telephone Encounter (Signed)
Close encounter 

## 2016-04-26 ENCOUNTER — Ambulatory Visit (INDEPENDENT_AMBULATORY_CARE_PROVIDER_SITE_OTHER): Payer: BLUE CROSS/BLUE SHIELD | Admitting: Family Medicine

## 2016-04-26 VITALS — BP 90/70 | HR 90 | Temp 98.7°F | Ht 64.0 in | Wt 228.3 lb

## 2016-04-26 DIAGNOSIS — E1165 Type 2 diabetes mellitus with hyperglycemia: Secondary | ICD-10-CM

## 2016-04-26 DIAGNOSIS — E781 Pure hyperglyceridemia: Secondary | ICD-10-CM

## 2016-04-26 DIAGNOSIS — Z23 Encounter for immunization: Secondary | ICD-10-CM | POA: Diagnosis not present

## 2016-04-26 DIAGNOSIS — E669 Obesity, unspecified: Secondary | ICD-10-CM | POA: Diagnosis not present

## 2016-04-26 DIAGNOSIS — E559 Vitamin D deficiency, unspecified: Secondary | ICD-10-CM | POA: Diagnosis not present

## 2016-04-26 LAB — HEMOGLOBIN A1C: Hgb A1c MFr Bld: 8 % — ABNORMAL HIGH (ref 4.6–6.5)

## 2016-04-26 LAB — VITAMIN D 25 HYDROXY (VIT D DEFICIENCY, FRACTURES): VITD: 19.52 ng/mL — AB (ref 30.00–100.00)

## 2016-04-26 LAB — HM DIABETES FOOT EXAM: HM Diabetic Foot Exam: NORMAL

## 2016-04-26 NOTE — Progress Notes (Signed)
Pre visit review using our clinic review tool, if applicable. No additional management support is needed unless otherwise documented below in the visit note. 

## 2016-04-26 NOTE — Progress Notes (Signed)
Subjective:     Patient ID: Savannah Wise, female   DOB: 10-Oct-1970, 45 y.o.   MRN: [id number]  HPI  Medical follow-up. Patient accompanied by her husband. They just returned back to the [place] from [place]. They visit here twice per year.  Type 2 diabetes. History of poor control. Poor compliance with exercise. We have recommended addition of GLP-1 medication several months ago but she never went on this. Last A1c 8.3%. Recent fasting blood sugars have been fairly stable around 130. No hypoglycemia. She had eye exam in December with no retinopathy  Hypertension treated with Diovan HCTZ. Blood pressure stable  Vitamin D deficiency. She had vitamin D of 12 and we gave her prescription replacement. She ran out of that medication about 2 months ago  Past Medical History:  Diagnosis Date  . Anemia    mild  . Diabetes mellitus without complication (HCC)    type 2 dx 2013  . Dizzy spells   . Exogenous obesity    mild  . HA (headache)    every morning  . Hypertension   . Hypertriglyceridemia   . Shortness of breath dyspnea    with walking-EKG and check up done   Past Surgical History:  Procedure Laterality Date  . BREAST SURGERY Left    breast biopsy 2010  . HERNIA REPAIR  2006   abdominal  . INSERTION OF MESH N/A 04/15/2015   Procedure: INSERTION OF MESH;  Surgeon: Avel Peace, MD;  Location: [facility];  Service: General;  Laterality: N/A;  . LAPAROSCOPIC LYSIS OF ADHESIONS N/A 04/15/2015   Procedure: LAPAROSCOPIC LYSIS OF ADHESIONS;  Surgeon: Avel Peace, MD;  Location: [facility];  Service: General;  Laterality: N/A;  . VENTRAL HERNIA REPAIR N/A 04/15/2015   Procedure: LAPAROSCOPIC RECURRENT VENTRAL HERNIA REPAIR WITH MESH;  Surgeon: Avel Peace, MD;  Location: [facility];  Service: General;  Laterality: N/A;    reports that she has never smoked. She has never used smokeless tobacco. She reports that she does not drink alcohol or use drugs. family history includes Arthritis in her  father; Diabetes in her father and mother; Heart disease in her father and mother; Hyperlipidemia in her father and mother; Hypertension in her father and mother. Allergies  Allergen Reactions  . Iodinated Diagnostic Agents Hives and Rash    S/P 10 MIN AFTER RECEIVING CONTRAST DEVELOPED HIVES UPPER CHEST /NECK AND LEGS , PATIENT RECEIVED 50 MG BENADRYL/MMS  . Omnipaque [Iohexol] Hives, Itching and Swelling  . Penicillins     History of family allergy only- loss of hearing    Review of Systems  Constitutional: Negative for fatigue.  Eyes: Negative for visual disturbance.  Respiratory: Negative for cough, chest tightness, shortness of breath and wheezing.   Cardiovascular: Negative for chest pain, palpitations and leg swelling.  Neurological: Negative for dizziness, seizures, syncope, weakness, light-headedness and headaches.       Objective:   Physical Exam  Constitutional: She appears well-developed and well-nourished.  Eyes: Pupils are equal, round, and reactive to light.  Neck: Neck supple. No JVD present. No thyromegaly present.  Cardiovascular: Normal rate and regular rhythm.  Exam reveals no gallop.   Pulmonary/Chest: Effort normal and breath sounds normal. No respiratory distress. She has no wheezes. She has no rales.  Musculoskeletal: She exhibits no edema.  Neurological: She is alert.  Skin:  Feet reveal no skin lesions. Good distal foot pulses. Good capillary refill. No calluses. Normal sensation with monofilament testing  Assessment:     #1 type 2 diabetes. History of poor control and poor compliance  #2 hypertension well controlled  #3 vitamin D deficiency  #4 hyperlipidemia with lipids checked last January and stable    Plan:     -Recheck labs today with vitamin D level and hemoglobin A1c -Patient requesting tetanus booster and this does appear to be due -She is encouraged to lose some weight -If A1c not improved consider addition of GLP-1-1  medication  Kristian Covey MD [facility]

## 2016-04-26 NOTE — Patient Instructions (Signed)
We will call you with lab results Continue with weight loss efforts. We will consider additional diabetes medication if not better controlled.

## 2016-05-01 ENCOUNTER — Ambulatory Visit (INDEPENDENT_AMBULATORY_CARE_PROVIDER_SITE_OTHER): Payer: BLUE CROSS/BLUE SHIELD | Admitting: Internal Medicine

## 2016-05-01 ENCOUNTER — Encounter: Payer: Self-pay | Admitting: Internal Medicine

## 2016-05-01 VITALS — BP 109/75 | HR 89 | Ht 64.0 in | Wt 226.8 lb

## 2016-05-01 DIAGNOSIS — E669 Obesity, unspecified: Secondary | ICD-10-CM | POA: Diagnosis not present

## 2016-05-01 DIAGNOSIS — I119 Hypertensive heart disease without heart failure: Secondary | ICD-10-CM

## 2016-05-01 DIAGNOSIS — E781 Pure hyperglyceridemia: Secondary | ICD-10-CM

## 2016-05-01 DIAGNOSIS — E785 Hyperlipidemia, unspecified: Secondary | ICD-10-CM | POA: Diagnosis not present

## 2016-05-01 NOTE — Patient Instructions (Signed)
Your physician recommends that you return for lab work FASTING to check cholesterol   Your physician wants you to follow-up in: 6 months with Dr. Hilty. You will receive a reminder letter in the mail two months in advance. If you don't receive a letter, please call our office to schedule the follow-up appointment.  

## 2016-05-01 NOTE — Progress Notes (Signed)
OFFICE NOTE  Chief Complaint:  Establish cardiologist  Primary Care Physician: Savannah Wise,Savannah W, MD  HPI:  Savannah Wise is a 45 y.o. female with a history of hypertension, obesity, hypertriglyceridemia and type 2 diabetes, not on insulin, who presents for evaluation and risk factor modifications for cardiovascular prevention. Family history significant for heart disease in her father who I treated in the past. Overall she reports feeling fairly well. She occasionally gets headaches about twice a month of waking up in the morning but denies any symptoms of sleep apnea, generally feels rested and is not known to snore or stop breathing. Blood pressures been very well controlled on current medicine. Her recent A1c was 8.0 and she is on medications through her primary care provider. She's been working on exercise and some weight loss. She's also been traveling a lot with her husband.  PMHx:  Past Medical History:  Diagnosis Date  . Anemia    mild  . Diabetes mellitus without complication (HCC)    type 2 dx 2013  . Dizzy spells   . Exogenous obesity    mild  . HA (headache)    every morning  . Hypertension   . Hypertriglyceridemia   . Shortness of breath dyspnea    with walking-EKG and check up done    Past Surgical History:  Procedure Laterality Date  . BREAST SURGERY Left    breast biopsy 2010  . HERNIA REPAIR  2006   abdominal  . INSERTION OF MESH N/A 04/15/2015   Procedure: INSERTION OF MESH;  Surgeon: Savannah Peaceodd Rosenbower, MD;  Location: [facility];  Service: General;  Laterality: N/A;  . LAPAROSCOPIC LYSIS OF ADHESIONS N/A 04/15/2015   Procedure: LAPAROSCOPIC LYSIS OF ADHESIONS;  Surgeon: Savannah Peaceodd Rosenbower, MD;  [facility];  Service: General;  Laterality: N/A;  . VENTRAL HERNIA REPAIR N/A 04/15/2015   Procedure: LAPAROSCOPIC RECURRENT VENTRAL HERNIA REPAIR WITH MESH;  Surgeon: Savannah Peaceodd Rosenbower, MD;  [facility];  Service: General;  Laterality: N/A;    FAMHx:  Family  History  Problem Relation Age of Onset  . Hyperlipidemia Mother   . Heart disease Mother   . Hypertension Mother   . Diabetes Mother   . Arthritis Father   . Hyperlipidemia Father   . Heart disease Father   . Hypertension Father   . Diabetes Father     SOCHx:   reports that she has never smoked. She has never used smokeless tobacco. She reports that she does not drink alcohol or use drugs.  ALLERGIES:  Allergies  Allergen Reactions  . Iodinated Diagnostic Agents Hives and Rash    S/P 10 MIN AFTER RECEIVING CONTRAST DEVELOPED HIVES UPPER CHEST /NECK AND LEGS , PATIENT RECEIVED 50 MG BENADRYL/MMS  . Omnipaque [Iohexol] Hives, Itching and Swelling  . Penicillins     History of family allergy only- loss of hearing    ROS: Pertinent items noted in HPI and remainder of comprehensive ROS otherwise negative.  HOME MEDS: Current Outpatient Prescriptions  Medication Sig Dispense Refill  . aspirin EC 81 MG tablet Take 81 mg by mouth daily.    Marland Kitchen. glimepiride (AMARYL) 4 MG tablet TAKE ONE TABLET BY MOUTH ONCE DAILY BEFORE BREAKFAST. 90 tablet 3  . INVOKANA 300 MG TABS tablet Take 300 mg by mouth daily before breakfast. 30 tablet 4  . metFORMIN (GLUCOPHAGE) 1000 MG tablet Take 1 tablet (1,000 mg total) by mouth 2 (two) times daily with a meal. 180 tablet 3  .  Naproxen Sodium (ALEVE PO) Take 2 tablets by mouth as needed (pain).     . valsartan-hydrochlorothiazide (DIOVAN-HCT) 160-12.5 MG per tablet TAKE ONE TABLET BY MOUTH ONCE DAILY 90 tablet 3  . Vitamin D, Ergocalciferol, (DRISDOL) 50000 units CAPS capsule Take 1 capsule (50,000 Units total) by mouth every 7 (seven) days. 30 capsule 0   No current facility-administered medications for this visit.     LABS/IMAGING: No results found for this or any previous visit (from the past 48 hour(s)). No results found.  WEIGHTS: Wt Readings from Last 3 Encounters:  05/01/16 226 lb 12.8 oz (102.9 kg)  04/26/16 228 lb 4.8 oz (103.6 kg)    09/29/15 232 lb 12.8 oz (105.6 kg)    VITALS: BP 109/75   Pulse 89   Ht  (1.626 m)   Wt 226 lb 12.8 oz (102.9 kg)   BMI 38.93 kg/m   EXAM: General appearance: alert, no distress and moderately obese Neck: no carotid bruit and no JVD Lungs: clear to auscultation bilaterally Heart: regular rate and rhythm Abdomen: soft, non-tender; bowel sounds normal; no masses,  no organomegaly Extremities: extremities normal, atraumatic, no cyanosis or edema Pulses: 2+ and symmetric Skin: Skin color, texture, turgor normal. No rashes or lesions Neurologic: Grossly normal Psych: Pleasant  EKG: Normal sinus rhythm at 89  ASSESSMENT: 1. Type 2 diabetes 2. Moderate obesity 3. Hypertension 4. Dyslipidemia 5. Family history of coronary disease  PLAN: 1.   Savannah Wise has no personal history of heart disease but is been followed closely by Dr. Patty Wise the past for cardio vascular risk factors include diabetes, obesity, hypertension and dyslipidemia. She seems to have good control over most of these however continues to need to work on weight loss and exercise. Her A1c is still elevated at 8.0 and her primary care provider is working on this. I'll go ahead and recheck a lipid profile today. Plan to see her back in 6 months.  Savannah Nose, MD, [facility] Attending Cardiologist [facility]  Savannah Wise [facility] 05/01/2016, 10:24 AM

## 2016-05-02 LAB — LIPID PANEL
CHOLESTEROL: 138 mg/dL (ref 125–200)
HDL: 34 mg/dL — ABNORMAL LOW (ref >=46)
LDL Cholesterol: 65 mg/dL (ref <130)
TRIGLYCERIDES: 196 mg/dL — AB (ref <150)
Total CHOL/HDL Ratio: 4.1 Ratio (ref <=5.0)
VLDL: 39 mg/dL — AB (ref <30)

## 2016-05-02 MED ORDER — VITAMIN D (ERGOCALCIFEROL) 1.25 MG (50000 UNIT) PO CAPS: 50000.0000 [IU] | ORAL_CAPSULE | ORAL | 5 refills | Status: DC

## 2016-05-02 NOTE — Addendum Note (Signed)
Addended by: Johnella MoloneyFUNDERBURK, JO A on: 05/02/2016 03:21 PM   Modules accepted: Orders

## 2016-05-08 ENCOUNTER — Ambulatory Visit: Payer: BLUE CROSS/BLUE SHIELD | Admitting: Internal Medicine

## 2016-05-09 ENCOUNTER — Encounter: Payer: Self-pay | Admitting: Family Medicine

## 2016-05-09 ENCOUNTER — Other Ambulatory Visit: Payer: Self-pay | Admitting: Family Medicine

## 2016-05-09 MED ORDER — DULAGLUTIDE 0.75 MG/0.5ML ~~LOC~~ SOAJ: 0.7500 mg | SUBCUTANEOUS | 0 refills | Status: DC

## 2016-05-09 NOTE — Addendum Note (Signed)
Addended by: Zollie PeeONDON, SIERRA A on: 05/09/2016 09:39 AM   Modules accepted: Orders

## 2016-05-10 ENCOUNTER — Telehealth: Payer: Self-pay | Admitting: Family Medicine

## 2016-05-10 ENCOUNTER — Other Ambulatory Visit: Payer: Self-pay | Admitting: Family Medicine

## 2016-05-10 MED ORDER — INVOKANA 300 MG PO TABS: 300.0000 mg | ORAL_TABLET | Freq: Every day | ORAL | 3 refills | Status: DC

## 2016-05-10 MED ORDER — VALSARTAN-HYDROCHLOROTHIAZIDE 160-12.5 MG PO TABS: 1.0000 | ORAL_TABLET | Freq: Every day | ORAL | 3 refills | Status: DC

## 2016-05-10 MED ORDER — GLIMEPIRIDE 4 MG PO TABS: ORAL_TABLET | ORAL | 3 refills | Status: DC

## 2016-05-10 MED ORDER — METFORMIN HCL 1000 MG PO TABS: 1000.0000 mg | ORAL_TABLET | Freq: Two times a day (BID) | ORAL | 3 refills | Status: DC

## 2016-05-10 MED ORDER — DULAGLUTIDE 0.75 MG/0.5ML ~~LOC~~ SOAJ: 0.7500 mg | SUBCUTANEOUS | 2 refills | Status: DC

## 2016-05-10 NOTE — Telephone Encounter (Signed)
Refill sent for patient.

## 2016-05-10 NOTE — Telephone Encounter (Signed)
Please clarify.  What is she requesting?

## 2016-05-10 NOTE — Telephone Encounter (Signed)
Pt is leaving country on this friday and needs medications for 90 day trulicity, metformin 1000 mg #180, ivokana, glimepiride 4 mg, valsartan-hctz and vit d 50000. [facility] [address]

## 2016-05-15 MED ORDER — VITAMIN D (ERGOCALCIFEROL) 1.25 MG (50000 UNIT) PO CAPS: 50000.0000 [IU] | ORAL_CAPSULE | ORAL | 3 refills | Status: DC

## 2016-06-27 HISTORY — PX: OTHER SURGICAL HISTORY: SHX169

## 2016-09-20 ENCOUNTER — Other Ambulatory Visit: Payer: Self-pay | Admitting: Emergency Medicine

## 2016-09-20 ENCOUNTER — Ambulatory Visit (INDEPENDENT_AMBULATORY_CARE_PROVIDER_SITE_OTHER): Payer: BLUE CROSS/BLUE SHIELD | Admitting: Family Medicine

## 2016-09-20 ENCOUNTER — Encounter: Payer: Self-pay | Admitting: Family Medicine

## 2016-09-20 ENCOUNTER — Ambulatory Visit: Payer: BLUE CROSS/BLUE SHIELD | Admitting: Family Medicine

## 2016-09-20 VITALS — BP 100/62 | HR 83 | Temp 98.2°F | Ht 64.0 in | Wt 179.0 lb

## 2016-09-20 DIAGNOSIS — E783 Hyperchylomicronemia: Secondary | ICD-10-CM | POA: Diagnosis not present

## 2016-09-20 DIAGNOSIS — L659 Nonscarring hair loss, unspecified: Secondary | ICD-10-CM | POA: Diagnosis not present

## 2016-09-20 DIAGNOSIS — E1165 Type 2 diabetes mellitus with hyperglycemia: Secondary | ICD-10-CM

## 2016-09-20 DIAGNOSIS — I119 Hypertensive heart disease without heart failure: Secondary | ICD-10-CM | POA: Diagnosis not present

## 2016-09-20 DIAGNOSIS — Z23 Encounter for immunization: Secondary | ICD-10-CM | POA: Diagnosis not present

## 2016-09-20 DIAGNOSIS — E559 Vitamin D deficiency, unspecified: Secondary | ICD-10-CM | POA: Diagnosis not present

## 2016-09-20 LAB — BASIC METABOLIC PANEL
BUN: 14 mg/dL (ref 6–23)
CALCIUM: 9.4 mg/dL (ref 8.4–10.5)
CO2: 27 meq/L (ref 19–32)
Chloride: 102 mEq/L (ref 96–112)
Creatinine, Ser: 0.51 mg/dL (ref 0.40–1.20)
GFR: 137.35 mL/min (ref >60.00)
GLUCOSE: 93 mg/dL (ref 70–99)
POTASSIUM: 4.7 meq/L (ref 3.5–5.1)
SODIUM: 139 meq/L (ref 135–145)

## 2016-09-20 LAB — HEPATIC FUNCTION PANEL
ALBUMIN: 4.2 g/dL (ref 3.5–5.2)
ALT: 17 U/L (ref 0–35)
AST: 13 U/L (ref 0–37)
Alkaline Phosphatase: 54 U/L (ref 39–117)
Bilirubin, Direct: 0.1 mg/dL (ref 0.0–0.3)
Total Bilirubin: 0.7 mg/dL (ref 0.2–1.2)
Total Protein: 6.5 g/dL (ref 6.0–8.3)

## 2016-09-20 LAB — LIPID PANEL
Cholesterol: 163 mg/dL (ref 0–200)
HDL: 38.1 mg/dL — AB (ref >39.00)
LDL Cholesterol: 102 mg/dL — ABNORMAL HIGH (ref 0–99)
NONHDL: 124.41
TRIGLYCERIDES: 110 mg/dL (ref 0.0–149.0)
Total CHOL/HDL Ratio: 4
VLDL: 22 mg/dL (ref 0.0–40.0)

## 2016-09-20 LAB — VITAMIN D 25 HYDROXY (VIT D DEFICIENCY, FRACTURES): VITD: 23.58 ng/mL — ABNORMAL LOW (ref 30.00–100.00)

## 2016-09-20 LAB — HEMOGLOBIN A1C: HEMOGLOBIN A1C: 6 % (ref 4.6–6.5)

## 2016-09-20 LAB — TSH: TSH: 1.12 u[IU]/mL (ref 0.35–4.50)

## 2016-09-20 MED ORDER — VALSARTAN-HYDROCHLOROTHIAZIDE 80-12.5 MG PO TABS: 1.0000 | ORAL_TABLET | Freq: Every day | ORAL | 3 refills | Status: DC

## 2016-09-20 MED ORDER — GLIMEPIRIDE 1 MG PO TABS: 1.0000 mg | ORAL_TABLET | Freq: Every day | ORAL | 2 refills | Status: DC

## 2016-09-20 MED ORDER — METFORMIN HCL 1000 MG PO TABS: 1000.0000 mg | ORAL_TABLET | Freq: Every day | ORAL | 2 refills | Status: DC

## 2016-09-20 MED ORDER — METFORMIN HCL 500 MG PO TABS: 500.0000 mg | ORAL_TABLET | Freq: Every day | ORAL | 2 refills | Status: DC

## 2016-09-20 NOTE — Progress Notes (Signed)
Pre visit review using our clinic review tool, if applicable. No additional management support is needed unless otherwise documented below in the visit note. 

## 2016-09-20 NOTE — Progress Notes (Signed)
Subjective:     Patient ID: Savannah Wise, female   DOB: 07/06/1971, 45 y.o.   MRN: [id number]  HPI Patient seen for medical follow-up. She had bariatric surgery with gastric sleeve October 3 back in [place]. She has done extremely well since then. She has lost approximately 50 pounds. She's been able to reduce her Amaryl from 4 mg to 1 mg. She is currently on metformin 500 mg once daily. Her A1c reportedly one month ago was 6.4%. Her other medications include aspirin, valsartan HCTZ and diabetes medications as above. She has history of low vitamin D and is on replacement also taking multivitamin once daily. Still needs flu vaccine. Overall feels well. Increased stamina. She is also exercising some. No hypoglycemic symptoms.  She does report general alopecia over the past several months. She's also had some occasional difficulties with solid food dysphagia but symptoms are inconsistent. She has excellent appetite. No pain with swallowing. No history of esophageal stricture. Denies any recent reflux symptoms.  Past Medical History:  Diagnosis Date  . Anemia    mild  . Diabetes mellitus without complication (HCC)    type 2 dx 2013  . Dizzy spells   . Exogenous obesity    mild  . HA (headache)    every morning  . Hypertension   . Hypertriglyceridemia   . Shortness of breath dyspnea    with walking-EKG and check up done   Past Surgical History:  Procedure Laterality Date  . Bariactric Surgery  06/27/2016  . BREAST SURGERY Left    breast biopsy 2010  . HERNIA REPAIR  2006   abdominal  . INSERTION OF MESH N/A 04/15/2015   Procedure: INSERTION OF MESH;  Surgeon: Avel Peaceodd Rosenbower, MD;  [facility];  Service: General;  Laterality: N/A;  . LAPAROSCOPIC LYSIS OF ADHESIONS N/A 04/15/2015   Procedure: LAPAROSCOPIC LYSIS OF ADHESIONS;  Surgeon: Avel Peaceodd Rosenbower, MD;  [facility];  Service: General;  Laterality: N/A;  . VENTRAL HERNIA REPAIR N/A 04/15/2015   Procedure: LAPAROSCOPIC  RECURRENT VENTRAL HERNIA REPAIR WITH MESH;  Surgeon: Avel Peaceodd Rosenbower, MD;  [facility];  Service: General;  Laterality: N/A;    reports that she has never smoked. She has never used smokeless tobacco. She reports that she does not drink alcohol or use drugs. family history includes Arthritis in her father; Atrial fibrillation in her mother; COPD in her mother; Diabetes in her father and mother; Heart disease in her father and mother; Hyperlipidemia in her father and mother; Hypertension in her father and mother. Allergies  Allergen Reactions  . Iodinated Diagnostic Agents Hives and Rash    S/P 10 MIN AFTER RECEIVING CONTRAST DEVELOPED HIVES UPPER CHEST /NECK AND LEGS , PATIENT RECEIVED 50 MG BENADRYL/MMS  . Omnipaque [Iohexol] Hives, Itching and Swelling  . Penicillins     History of family allergy only- loss of hearing     Review of Systems  Constitutional: Negative for fatigue.  Eyes: Negative for visual disturbance.  Respiratory: Negative for cough, chest tightness, shortness of breath and wheezing.   Cardiovascular: Negative for chest pain, palpitations and leg swelling.  Neurological: Negative for dizziness, seizures, syncope, weakness, light-headedness and headaches.       Objective:   Physical Exam  Constitutional: She appears well-developed and well-nourished.  Eyes: Pupils are equal, round, and reactive to light.  Neck: Neck supple. No JVD present. No thyromegaly present.  Cardiovascular: Normal rate and regular rhythm.  Exam reveals no gallop.   Pulmonary/Chest:  Effort normal and breath sounds normal. No respiratory distress. She has no wheezes. She has no rales.  Musculoskeletal: She exhibits no edema.  Neurological: She is alert.       Assessment:     #1 history of morbid obesity which is improving following gastric sleeve surgery  #2 history of hypertension with repeat reading today seated of 90/58 improving with recent weight loss  #3 history of  dyslipidemia  #4 type 2 diabetes improving with weight loss  #5 history of vitamin D deficiency  #6 diffuse alopecia.     Plan:     -Recheck labs today with hemoglobin A1c, lipid panel, basic metabolic panel, hepatic panel, vitamin D level, TSH  -Reduce Diovan HCTZ to 80/12.5 mg 1 daily. May need even further reduction as her weight loss continues  -Flu vaccine given   Kristian CoveyBruce W Berkley Wrightsman MD [facility]

## 2016-09-21 DIAGNOSIS — H40012 Open angle with borderline findings, low risk, left eye: Secondary | ICD-10-CM | POA: Diagnosis not present

## 2016-09-21 DIAGNOSIS — E119 Type 2 diabetes mellitus without complications: Secondary | ICD-10-CM | POA: Diagnosis not present

## 2016-09-21 DIAGNOSIS — H25013 Cortical age-related cataract, bilateral: Secondary | ICD-10-CM | POA: Diagnosis not present

## 2016-09-21 DIAGNOSIS — H2513 Age-related nuclear cataract, bilateral: Secondary | ICD-10-CM | POA: Diagnosis not present

## 2016-09-21 DIAGNOSIS — H40013 Open angle with borderline findings, low risk, bilateral: Secondary | ICD-10-CM | POA: Diagnosis not present

## 2016-09-21 DIAGNOSIS — H40011 Open angle with borderline findings, low risk, right eye: Secondary | ICD-10-CM | POA: Diagnosis not present

## 2016-09-21 LAB — HM DIABETES EYE EXAM

## 2016-09-22 ENCOUNTER — Ambulatory Visit: Payer: BLUE CROSS/BLUE SHIELD | Admitting: Family Medicine

## 2016-09-24 ENCOUNTER — Encounter: Payer: Self-pay | Admitting: Family Medicine

## 2016-09-26 ENCOUNTER — Other Ambulatory Visit: Payer: Self-pay | Admitting: Emergency Medicine

## 2016-09-26 MED ORDER — VITAMIN D (ERGOCALCIFEROL) 1.25 MG (50000 UNIT) PO CAPS: 50000.0000 [IU] | ORAL_CAPSULE | ORAL | 5 refills | Status: DC

## 2016-09-29 ENCOUNTER — Encounter: Payer: Self-pay | Admitting: Family Medicine

## 2016-10-09 ENCOUNTER — Other Ambulatory Visit: Payer: Self-pay

## 2017-03-19 ENCOUNTER — Other Ambulatory Visit: Payer: Self-pay | Admitting: Obstetrics & Gynecology

## 2017-03-19 DIAGNOSIS — Z1231 Encounter for screening mammogram for malignant neoplasm of breast: Secondary | ICD-10-CM

## 2017-04-03 DIAGNOSIS — Z01419 Encounter for gynecological examination (general) (routine) without abnormal findings: Secondary | ICD-10-CM | POA: Diagnosis not present

## 2017-04-03 DIAGNOSIS — Z6827 Body mass index (BMI) 27.0-27.9, adult: Secondary | ICD-10-CM | POA: Diagnosis not present

## 2017-04-03 DIAGNOSIS — N912 Amenorrhea, unspecified: Secondary | ICD-10-CM | POA: Diagnosis not present

## 2017-04-04 ENCOUNTER — Ambulatory Visit (INDEPENDENT_AMBULATORY_CARE_PROVIDER_SITE_OTHER): Payer: BLUE CROSS/BLUE SHIELD | Admitting: Family Medicine

## 2017-04-04 VITALS — BP 94/64 | HR 59 | Temp 99.1°F | Wt 157.6 lb

## 2017-04-04 DIAGNOSIS — E1165 Type 2 diabetes mellitus with hyperglycemia: Secondary | ICD-10-CM | POA: Diagnosis not present

## 2017-04-04 DIAGNOSIS — E559 Vitamin D deficiency, unspecified: Secondary | ICD-10-CM | POA: Diagnosis not present

## 2017-04-04 DIAGNOSIS — E783 Hyperchylomicronemia: Secondary | ICD-10-CM | POA: Diagnosis not present

## 2017-04-04 DIAGNOSIS — N926 Irregular menstruation, unspecified: Secondary | ICD-10-CM | POA: Diagnosis not present

## 2017-04-04 LAB — LIPID PANEL
CHOL/HDL RATIO: 4
Cholesterol: 167 mg/dL (ref 0–200)
HDL: 43.9 mg/dL (ref >39.00)
LDL Cholesterol: 108 mg/dL — ABNORMAL HIGH (ref 0–99)
NONHDL: 123.3
Triglycerides: 78 mg/dL (ref 0.0–149.0)
VLDL: 15.6 mg/dL (ref 0.0–40.0)

## 2017-04-04 LAB — HEPATIC FUNCTION PANEL
ALBUMIN: 4.1 g/dL (ref 3.5–5.2)
ALK PHOS: 53 U/L (ref 39–117)
ALT: 6 U/L (ref 0–35)
AST: 7 U/L (ref 0–37)
BILIRUBIN TOTAL: 0.5 mg/dL (ref 0.2–1.2)
Bilirubin, Direct: 0.1 mg/dL (ref 0.0–0.3)
Total Protein: 6.4 g/dL (ref 6.0–8.3)

## 2017-04-04 LAB — BASIC METABOLIC PANEL
BUN: 19 mg/dL (ref 6–23)
CALCIUM: 9.3 mg/dL (ref 8.4–10.5)
CO2: 27 meq/L (ref 19–32)
Chloride: 103 mEq/L (ref 96–112)
Creatinine, Ser: 0.5 mg/dL (ref 0.40–1.20)
GFR: 140.2 mL/min (ref >60.00)
GLUCOSE: 93 mg/dL (ref 70–99)
Potassium: 4.8 mEq/L (ref 3.5–5.1)
SODIUM: 139 meq/L (ref 135–145)

## 2017-04-04 LAB — POCT GLYCOSYLATED HEMOGLOBIN (HGB A1C): Hemoglobin A1C: 5.6

## 2017-04-04 LAB — VITAMIN D 25 HYDROXY (VIT D DEFICIENCY, FRACTURES): VITD: 21.13 ng/mL — AB (ref 30.00–100.00)

## 2017-04-04 LAB — FOLLICLE STIMULATING HORMONE: FSH: 10.5 m[IU]/mL

## 2017-04-04 MED ORDER — VALSARTAN 80 MG PO TABS: 80.0000 mg | ORAL_TABLET | Freq: Every day | ORAL | 3 refills | Status: DC

## 2017-04-04 NOTE — Progress Notes (Signed)
Subjective:     Patient ID: Savannah Wise, female   DOB: 1971/05/21, 46 y.o.   MRN: [id number]  HPI  Patient seen for medical follow-up. She had gastric sleeve surgery in [place] back in October and has lost approximately 80 pounds since then. She is exercising regularly. Feels good overall. She's had some irregular menses and is seeing gynecologist. They recommend she get a FSH level. She recently has had some hypoglycemic episodes. Still taking Amaryl 1 mg daily and metformin 500 mg daily. Occasional lightheadedness. Also has history of low vitamin D. She is taking 50,000 international units supplement once weekly.  Past Medical History:  Diagnosis Date  . Anemia    mild  . Diabetes mellitus without complication (HCC)    type 2 dx 2013  . Dizzy spells   . Exogenous obesity    mild  . HA (headache)    every morning  . Hypertension   . Hypertriglyceridemia   . Shortness of breath dyspnea    with walking-EKG and check up done   Past Surgical History:  Procedure Laterality Date  . Bariactric Surgery  06/27/2016  . BREAST SURGERY Left    breast biopsy 2010  . HERNIA REPAIR  2006   abdominal  . INSERTION OF MESH N/A 04/15/2015   Procedure: INSERTION OF MESH;  Surgeon: Avel Peace, MD;  [facility];  Service: General;  Laterality: N/A;  . LAPAROSCOPIC LYSIS OF ADHESIONS N/A 04/15/2015   Procedure: LAPAROSCOPIC LYSIS OF ADHESIONS;  Surgeon: Avel Peace, MD;  [facility];  Service: General;  Laterality: N/A;  . VENTRAL HERNIA REPAIR N/A 04/15/2015   Procedure: LAPAROSCOPIC RECURRENT VENTRAL HERNIA REPAIR WITH MESH;  Surgeon: Avel Peace, MD;  [facility];  Service: General;  Laterality: N/A;    reports that she has never smoked. She has never used smokeless tobacco. She reports that she does not drink alcohol or use drugs. family history includes Arthritis in her father; Atrial fibrillation in her mother; COPD in her mother; Diabetes in her father and mother;  Heart disease in her father and mother; Hyperlipidemia in her father and mother; Hypertension in her father and mother. Allergies  Allergen Reactions  . Iodinated Diagnostic Agents Hives and Rash    S/P 10 MIN AFTER RECEIVING CONTRAST DEVELOPED HIVES UPPER CHEST /NECK AND LEGS , PATIENT RECEIVED 50 MG BENADRYL/MMS  . Omnipaque [Iohexol] Hives, Itching and Swelling  . Penicillins     History of family allergy only- loss of hearing    Review of Systems  Constitutional: Negative for fatigue.  Eyes: Negative for visual disturbance.  Respiratory: Negative for cough, chest tightness, shortness of breath and wheezing.   Cardiovascular: Negative for chest pain, palpitations and leg swelling.  Endocrine: Negative for polydipsia and polyuria.  Neurological: Positive for light-headedness. Negative for dizziness, seizures, syncope, weakness and headaches.       Objective:   Physical Exam  Constitutional: She appears well-developed and well-nourished.  Eyes: Pupils are equal, round, and reactive to light.  Neck: Neck supple. No JVD present. No thyromegaly present.  Cardiovascular: Normal rate and regular rhythm.  Exam reveals no gallop.   Pulmonary/Chest: Effort normal and breath sounds normal. No respiratory distress. She has no wheezes. She has no rales.  Musculoskeletal: She exhibits no edema.  Neurological: She is alert.  Skin:  Feet reveal no skin lesions. Good distal foot pulses. Good capillary refill. No calluses. Normal sensation with monofilament testing  Assessment:     #1 history of obesity which is improved dramatically following gastric sleeve surgery last October  #2 hypertension improved with some recent low blood pressure readings following weight loss  #3 type 2 diabetes greatly improved with A1c today 5.6%  #4 history of dyslipidemia  #5 low vitamin D  #6 irregular menses in process of workup with GYN. They're requesting FSH level today with her other labs     Plan:     -Check labs today with lipid panel, hepatic panel, basic metabolic panel, 25-hydroxy vitamin D, FSH -Discontinue Amaryl -Discontinue Diovan HCTZ and start plain Diovan 80 mg once daily -May need to discontinue Diovan altogether if lightheadedness continues -Routine follow-up in 6 months and sooner as needed  Kristian CoveyBruce W Antion Andres MD [facility]

## 2017-04-04 NOTE — Patient Instructions (Signed)
Stop the Glimepiride We will change to Diovan 80 mg once daily.

## 2017-04-05 ENCOUNTER — Ambulatory Visit
Admission: RE | Admit: 2017-04-05 | Discharge: 2017-04-05 | Disposition: A | Discharge status: Home / Self Care | Payer: BLUE CROSS/BLUE SHIELD | Source: Ambulatory Visit | Attending: Obstetrics & Gynecology | Admitting: Obstetrics & Gynecology

## 2017-04-05 DIAGNOSIS — Z1231 Encounter for screening mammogram for malignant neoplasm of breast: Secondary | ICD-10-CM

## 2017-04-06 ENCOUNTER — Ambulatory Visit (INDEPENDENT_AMBULATORY_CARE_PROVIDER_SITE_OTHER): Payer: BLUE CROSS/BLUE SHIELD | Admitting: Internal Medicine

## 2017-04-06 ENCOUNTER — Encounter: Payer: Self-pay | Admitting: Internal Medicine

## 2017-04-06 VITALS — BP 100/66 | HR 52 | Ht 64.0 in | Wt 158.6 lb

## 2017-04-06 DIAGNOSIS — I119 Hypertensive heart disease without heart failure: Secondary | ICD-10-CM | POA: Diagnosis not present

## 2017-04-06 DIAGNOSIS — E782 Mixed hyperlipidemia: Secondary | ICD-10-CM

## 2017-04-06 DIAGNOSIS — E119 Type 2 diabetes mellitus without complications: Secondary | ICD-10-CM

## 2017-04-06 DIAGNOSIS — E781 Pure hyperglyceridemia: Secondary | ICD-10-CM | POA: Diagnosis not present

## 2017-04-06 NOTE — Progress Notes (Signed)
OFFICE NOTE  Chief Complaint:  No complaints  Primary Care Physician: Kristian Covey, MD  HPI:  Savannah Wise is a 46 y.o. female with a history of hypertension, obesity, hypertriglyceridemia and type 2 diabetes, not on insulin, who presents for evaluation and risk factor modifications for cardiovascular prevention. Family history significant for heart disease in her father who I treated in the past. Overall she reports feeling fairly well. She occasionally gets headaches about twice a month of waking up in the morning but denies any symptoms of sleep apnea, generally feels rested and is not known to snore or stop breathing. Blood pressures been very well controlled on current medicine. Her recent A1c was 8.0 and she is on medications through her primary care provider. She's been working on exercise and some weight loss. She's also been traveling a lot with her husband.  04/06/2017  Savannah Wise returns today for follow-up. She seems to be doing pretty well. Recently she and her husband took a trip to [place]. They travel fairly often through there but stayed for short period of time. She has been working on diet to try to lower her hemoglobin A1c. This was tested 2 days ago, A1c is now 5.6. Fasting lipid profile shows total cholesterol 167, triglycerides 78, HDL-C 43, LDL-C 108. Goal LDL-C would be less than 70 as a diabetic however she has essentially nondiabetic based on her hemoglobin A1c with declining need for medications. She is only on low-dose metformin at this point. I think it's reasonable to continue current therapy and I would not recommend starting statin at this time as long as she remains low with regards to her blood sugars. Blood pressure recently is been running low as well and her primary care provider took her off of the HCTZ component of her valsartan. Blood pressure today was 100/66 in the office. She said she had felt dizzy but has recently felt better.  PMHx:  Past  Medical History:  Diagnosis Date  . Anemia    mild  . Diabetes mellitus without complication (HCC)    type 2 dx 2013  . Dizzy spells   . Exogenous obesity    mild  . HA (headache)    every morning  . Hypertension   . Hypertriglyceridemia   . Shortness of breath dyspnea    with walking-EKG and check up done    Past Surgical History:  Procedure Laterality Date  . Bariactric Surgery  06/27/2016  . BREAST BIOPSY Left 1991  . BREAST SURGERY Left    breast biopsy 2010  . HERNIA REPAIR  2006   abdominal  . INSERTION OF MESH N/A 04/15/2015   Procedure: INSERTION OF MESH;  Surgeon: Avel Peace, MD;  [facility];  Service: General;  Laterality: N/A;  . LAPAROSCOPIC LYSIS OF ADHESIONS N/A 04/15/2015   Procedure: LAPAROSCOPIC LYSIS OF ADHESIONS;  Surgeon: Avel Peace, MD;  [facility];  Service: General;  Laterality: N/A;  . VENTRAL HERNIA REPAIR N/A 04/15/2015   Procedure: LAPAROSCOPIC RECURRENT VENTRAL HERNIA REPAIR WITH MESH;  Surgeon: Avel Peace, MD;  [facility];  Service: General;  Laterality: N/A;    FAMHx:  Family History  Problem Relation Age of Onset  . Hyperlipidemia Mother   . Heart disease Mother   . Hypertension Mother   . Diabetes Mother   . Atrial fibrillation Mother   . COPD Mother   . Arthritis Father   . Hyperlipidemia Father   . Heart disease Father   .  Hypertension Father   . Diabetes Father     SOCHx:   reports that she has never smoked. She has never used smokeless tobacco. She reports that she does not drink alcohol or use drugs.  ALLERGIES:  Allergies  Allergen Reactions  . Iodinated Diagnostic Agents Hives and Rash    S/P 10 MIN AFTER RECEIVING CONTRAST DEVELOPED HIVES UPPER CHEST /NECK AND LEGS , PATIENT RECEIVED 50 MG BENADRYL/MMS  . Omnipaque [Iohexol] Hives, Itching and Swelling  . Penicillins     History of family allergy only- loss of hearing    ROS: Pertinent items noted in HPI and remainder of comprehensive  ROS otherwise negative.  HOME MEDS: Current Outpatient Prescriptions  Medication Sig Dispense Refill  . aspirin EC 81 MG tablet Take 81 mg by mouth daily.    . metFORMIN (GLUCOPHAGE) 500 MG tablet Take 1 tablet (500 mg total) by mouth daily with breakfast. 30 tablet 2  . Naproxen Sodium (ALEVE PO) Take 2 tablets by mouth as needed (pain).     . valsartan (DIOVAN) 80 MG tablet Take 1 tablet (80 mg total) by mouth daily. 90 tablet 3  . Vitamin D, Ergocalciferol, (DRISDOL) 50000 units CAPS capsule Take 1 capsule (50,000 Units total) by mouth every 7 (seven) days. 4 capsule 5   No current facility-administered medications for this visit.     LABS/IMAGING: Results for orders placed or performed in visit on 04/04/17 (from the past 48 hour(s))  POCT glycosylated hemoglobin (Hb A1C)     Status: None   Collection Time: 04/04/17  8:36 AM  Result Value Ref Range   Hemoglobin A1C 5.6   Basic metabolic panel     Status: None   Collection Time: 04/04/17  8:55 AM  Result Value Ref Range   Sodium 139 135 - 145 mEq/L   Potassium 4.8 3.5 - 5.1 mEq/L   Chloride 103 96 - 112 mEq/L   CO2 27 19 - 32 mEq/L   Glucose, Bld 93 70 - 99 mg/dL   BUN 19 6 - 23 mg/dL   Creatinine, Ser 7.820.50 0.40 - 1.20 mg/dL   Calcium 9.3 8.4 - 95.610.5 mg/dL   GFR 213.08140.20 >65.78>60.00 mL/min  Lipid panel     Status: Abnormal   Collection Time: 04/04/17  8:55 AM  Result Value Ref Range   Cholesterol 167 0 - 200 mg/dL    Comment: ATP III Classification       Desirable:  < 200 mg/dL               Borderline High:  200 - 239 mg/dL          High:  > = 469240 mg/dL   Triglycerides 62.978.0 0.0 - 149.0 mg/dL    Comment: Normal:  <528<150 mg/dLBorderline High:  150 - 199 mg/dL   HDL 41.3243.90 >44.01>39.00 mg/dL   VLDL 02.715.6 0.0 - 25.340.0 mg/dL   LDL Cholesterol 664108 (H) 0 - 99 mg/dL   Total CHOL/HDL Ratio 4     Comment:                Men          Women1/2 Average Risk     3.4          3.3Average Risk          5.0          4.42X Average Risk          9.6  7.13X Average Risk          15.0          11.0                       NonHDL 123.30     Comment: NOTE:  Non-HDL goal should be 30 mg/dL higher than patient's LDL goal (i.e. LDL goal of < 70 mg/dL, would have non-HDL goal of < 100 mg/dL)  Hepatic function panel     Status: None   Collection Time: 04/04/17  8:55 AM  Result Value Ref Range   Total Bilirubin 0.5 0.2 - 1.2 mg/dL   Bilirubin, Direct 0.1 0.0 - 0.3 mg/dL   Alkaline Phosphatase 53 39 - 117 U/L   AST 7 0 - 37 U/L   ALT 6 0 - 35 U/L   Total Protein 6.4 6.0 - 8.3 g/dL   Albumin 4.1 3.5 - 5.2 g/dL  VITAMIN D 25 Hydroxy (Vit-D Deficiency, Fractures)     Status: Abnormal   Collection Time: 04/04/17  8:55 AM  Result Value Ref Range   VITD 21.13 (L) 30.00 - 100.00 ng/mL  Follicle Stimulating Hormone     Status: None   Collection Time: 04/04/17  8:55 AM  Result Value Ref Range   FSH 10.5 mIU/ML    Comment: Female Reference Range:  1.4-18.1 mIU/mLFemale Reference Range:Follicular Phase          2.5-10.2 mIU/mLMidCycle Peak          3.4-33.4 mIU/mLLuteal Phase          1.5-9.1 mIU/mLPost Menopausal     23.0-116.3 mIU/mLPregnant          <0.3 mIU/mL   No results found.  WEIGHTS: Wt Readings from Last 3 Encounters:  04/06/17 158 lb 9.6 oz (71.9 kg)  04/04/17 157 lb 9.6 oz (71.5 kg)  09/20/16 179 lb (81.2 kg)    VITALS: BP 100/66   Pulse (!) 52   Ht 5\' 4"  (1.626 m)   Wt 158 lb 9.6 oz (71.9 kg)   BMI 27.22 kg/m   EXAM: General appearance: alert, no distress and moderately obese Neck: no carotid bruit and no JVD Lungs: clear to auscultation bilaterally Heart: regular rate and rhythm Abdomen: soft, non-tender; bowel sounds normal; no masses,  no organomegaly Extremities: extremities normal, atraumatic, no cyanosis or edema Pulses: 2+ and symmetric Skin: Skin color, texture, turgor normal. No rashes or lesions Neurologic: Grossly normal Psych: Pleasant  EKG: Sinus bradycardia 52   ASSESSMENT: 1. Type 2 diabetes - however,  A1c now 5.6 on low dose metformin 2. Moderate obesity 3. Hypertension 4. Dyslipidemia 5. Family history of coronary disease  PLAN: 1.   Savannah Wise seems to have improved her type 2 diabetes with marked changes in diet and weight loss. Her hemoglobin A1c is now 5.6 only on low-dose metformin. She may no longer be diabetic. She has lost weight significantly BMI today is only 27. Her blood pressure is at goal in fact required less medication. Although her LDL-C is greater than 100, I would not yet advocate for statin therapy. I think her goal should be less than 100 although in diabetics less than 70 would be ideal.  Follow-up in 6 months per their request.  [name], MD, [facility] Attending Cardiologist [facility]  [name] 04/06/2017, 8:34 AM

## 2017-04-06 NOTE — Patient Instructions (Signed)
Your physician wants you to follow-up in: December 2018. You will receive a reminder letter in the mail two months in advance. If you don't receive a letter, please call our office to schedule the follow-up appointment.

## 2017-04-09 ENCOUNTER — Other Ambulatory Visit: Payer: Self-pay | Admitting: Obstetrics & Gynecology

## 2017-04-09 DIAGNOSIS — R928 Other abnormal and inconclusive findings on diagnostic imaging of breast: Secondary | ICD-10-CM

## 2017-04-10 ENCOUNTER — Other Ambulatory Visit: Payer: Self-pay

## 2017-04-10 DIAGNOSIS — N858 Other specified noninflammatory disorders of uterus: Secondary | ICD-10-CM | POA: Diagnosis not present

## 2017-04-10 DIAGNOSIS — N950 Postmenopausal bleeding: Secondary | ICD-10-CM | POA: Diagnosis not present

## 2017-04-10 DIAGNOSIS — N840 Polyp of corpus uteri: Secondary | ICD-10-CM | POA: Diagnosis not present

## 2017-04-10 MED ORDER — VITAMIN D 50 MCG (2000 UT) PO TABS: 2000.0000 [IU] | ORAL_TABLET | Freq: Every day | ORAL | 3 refills | Status: DC

## 2017-04-13 ENCOUNTER — Other Ambulatory Visit: Payer: BLUE CROSS/BLUE SHIELD

## 2017-04-13 ENCOUNTER — Ambulatory Visit
Admission: RE | Admit: 2017-04-13 | Discharge: 2017-04-13 | Disposition: A | Discharge status: Home / Self Care | Payer: BLUE CROSS/BLUE SHIELD | Source: Ambulatory Visit | Attending: Obstetrics & Gynecology | Admitting: Obstetrics & Gynecology

## 2017-04-13 ENCOUNTER — Ambulatory Visit: Admission: RE | Admit: 2017-04-13 | Payer: BLUE CROSS/BLUE SHIELD | Source: Ambulatory Visit

## 2017-04-13 DIAGNOSIS — R922 Inconclusive mammogram: Secondary | ICD-10-CM | POA: Diagnosis not present

## 2017-04-13 DIAGNOSIS — R928 Other abnormal and inconclusive findings on diagnostic imaging of breast: Secondary | ICD-10-CM

## 2017-06-14 ENCOUNTER — Encounter: Payer: Self-pay | Admitting: Family Medicine

## 2017-09-12 DIAGNOSIS — N812 Incomplete uterovaginal prolapse: Secondary | ICD-10-CM | POA: Diagnosis not present

## 2017-09-12 DIAGNOSIS — D259 Leiomyoma of uterus, unspecified: Secondary | ICD-10-CM | POA: Diagnosis not present

## 2017-09-17 DIAGNOSIS — D649 Anemia, unspecified: Secondary | ICD-10-CM | POA: Insufficient documentation

## 2017-09-17 DIAGNOSIS — R42 Dizziness and giddiness: Secondary | ICD-10-CM | POA: Insufficient documentation

## 2017-09-17 DIAGNOSIS — I10 Essential (primary) hypertension: Secondary | ICD-10-CM | POA: Insufficient documentation

## 2017-09-17 DIAGNOSIS — E119 Type 2 diabetes mellitus without complications: Secondary | ICD-10-CM | POA: Insufficient documentation

## 2017-09-21 ENCOUNTER — Ambulatory Visit: Payer: BLUE CROSS/BLUE SHIELD | Admitting: Family Medicine

## 2017-09-21 ENCOUNTER — Encounter: Payer: Self-pay | Admitting: Family Medicine

## 2017-09-21 VITALS — BP 102/80 | HR 65 | Temp 97.7°F | Ht 64.0 in | Wt 156.1 lb

## 2017-09-21 DIAGNOSIS — E119 Type 2 diabetes mellitus without complications: Secondary | ICD-10-CM

## 2017-09-21 DIAGNOSIS — J019 Acute sinusitis, unspecified: Secondary | ICD-10-CM | POA: Diagnosis not present

## 2017-09-21 DIAGNOSIS — I10 Essential (primary) hypertension: Secondary | ICD-10-CM

## 2017-09-21 LAB — POCT GLYCOSYLATED HEMOGLOBIN (HGB A1C): HEMOGLOBIN A1C: 5.9

## 2017-09-21 MED ORDER — AMOXICILLIN-POT CLAVULANATE 875-125 MG PO TABS: 1.0000 | ORAL_TABLET | Freq: Two times a day (BID) | ORAL | 0 refills | Status: DC

## 2017-09-21 NOTE — Progress Notes (Signed)
Subjective:     Patient ID: Savannah RummageHuda A Rohlfs, female   DOB: 01/25/1971, 46 y.o.   MRN: [id number]  HPI Patient seen for medical follow-up. She had gastric bypass over year ago and has done a fairly good job keeping her weight down. She was able to come off several medications. Only current medication is valsartan 80/12.5 mg 1 daily. Last A1c was 5.6%. We took her off metformin. She still exercising fairly regularly.  Other issue is she states she developed a "cold" about 10 days ago. She now has some persistent pansinusitis fullness and occasional headaches. Occasional thick mucus. Occasional cough. No fevers or chills.  She has hypertension. Was on plain valsartan and but was concerned about some ankle edema and was switched back to valsartan HCTZ. Blood pressure stable. No dizziness.  Past Medical History:  Diagnosis Date  . Anemia    mild  . Diabetes mellitus without complication (HCC)    type 2 dx 2013  . Dizzy spells   . Exogenous obesity    mild  . HA (headache)    every morning  . Hypertension   . Hypertriglyceridemia   . Shortness of breath dyspnea    with walking-EKG and check up done   Past Surgical History:  Procedure Laterality Date  . Bariactric Surgery  06/27/2016  . BREAST BIOPSY Left 1991  . BREAST SURGERY Left    breast biopsy 2010  . HERNIA REPAIR  2006   abdominal  . INSERTION OF MESH N/A 04/15/2015   Procedure: INSERTION OF MESH;  Surgeon: Avel Peaceodd Rosenbower, MD;  Location: [facility];  Service: General;  Laterality: N/A;  . LAPAROSCOPIC LYSIS OF ADHESIONS N/A 04/15/2015   Procedure: LAPAROSCOPIC LYSIS OF ADHESIONS;  Surgeon: Avel Peaceodd Rosenbower, MD;  Location: [facility];  Service: General;  Laterality: N/A;  . VENTRAL HERNIA REPAIR N/A 04/15/2015   Procedure: LAPAROSCOPIC RECURRENT VENTRAL HERNIA REPAIR WITH MESH;  Surgeon: Avel Peaceodd Rosenbower, MD;  Location: [facility];  Service: General;  Laterality: N/A;    reports that  has never smoked. she has never used smokeless tobacco. She  reports that she does not drink alcohol or use drugs. family history includes Arthritis in her father; Atrial fibrillation in her mother; COPD in her mother; Diabetes in her father and mother; Heart disease in her father and mother; Hyperlipidemia in her father and mother; Hypertension in her father and mother. Allergies  Allergen Reactions  . Iodinated Diagnostic Agents Hives and Rash    S/P 10 MIN AFTER RECEIVING CONTRAST DEVELOPED HIVES UPPER CHEST /NECK AND LEGS , PATIENT RECEIVED 50 MG BENADRYL/MMS  . Omnipaque [Iohexol] Hives, Itching and Swelling     Review of Systems  Constitutional: Negative for fatigue and unexpected weight change.  HENT: Positive for congestion, sinus pressure and sinus pain.   Eyes: Negative for visual disturbance.  Respiratory: Positive for cough. Negative for chest tightness, shortness of breath and wheezing.   Cardiovascular: Negative for chest pain, palpitations and leg swelling.  Endocrine: Negative for polydipsia and polyuria.  Neurological: Negative for dizziness, seizures, syncope, weakness, light-headedness and headaches.       Objective:   Physical Exam  Constitutional: She appears well-developed and well-nourished.  HENT:  Right Ear: External ear normal.  Left Ear: External ear normal.  Mouth/Throat: Oropharynx is clear and moist.  Neck: Neck supple.  Cardiovascular: Normal rate.  Pulmonary/Chest: Effort normal and breath sounds normal. No respiratory distress. She has no wheezes. She has no rales.  Musculoskeletal: She exhibits no edema.  Lymphadenopathy:    She has no cervical adenopathy.       Assessment:     #1 hypertension stable and at goal  #2 type 2 diabetes greatly improved since her weight loss following gastric bypass. A1c today 5.9%  #3 URI symptoms. Probably initially viral. May be developing secondary sinusitis    Plan:     -Continue regular exercise habits. -Routine follow-up in 6 months -Printed prescription for  Augmentin 875 mg to start twice daily if she has any purulent secretions or persistent or worsening sinusitis symptoms  Kristian CoveyBruce W Zamya Culhane MD [facility]

## 2017-09-21 NOTE — Patient Instructions (Signed)
Continue with regular exercise habits We need to repeat Vit D level at follow up.

## 2017-09-24 ENCOUNTER — Encounter: Payer: Self-pay | Admitting: Internal Medicine

## 2017-09-24 ENCOUNTER — Ambulatory Visit: Payer: BLUE CROSS/BLUE SHIELD | Admitting: Internal Medicine

## 2017-09-24 VITALS — BP 96/68 | HR 59 | Ht 64.0 in | Wt 155.6 lb

## 2017-09-24 DIAGNOSIS — E782 Mixed hyperlipidemia: Secondary | ICD-10-CM

## 2017-09-24 DIAGNOSIS — I119 Hypertensive heart disease without heart failure: Secondary | ICD-10-CM

## 2017-09-24 DIAGNOSIS — I10 Essential (primary) hypertension: Secondary | ICD-10-CM

## 2017-09-24 MED ORDER — VALSARTAN 40 MG PO TABS: 40.0000 mg | ORAL_TABLET | Freq: Every day | ORAL | 5 refills | Status: DC

## 2017-09-24 NOTE — Addendum Note (Signed)
Addended by: Sandi MariscalICHARDSON, Ivana Nicastro Y on: 09/24/2017 11:23 AM   Modules accepted: Orders

## 2017-09-24 NOTE — Progress Notes (Signed)
OFFICE NOTE  Chief Complaint:  Head congestion  Primary Care Physician: Kristian CoveyBurchette, Bruce W, MD  HPI:  Savannah RummageHuda A Wise is a 46 y.o. female with a history of hypertension, obesity, hypertriglyceridemia and type 2 diabetes, not on insulin, who presents for evaluation and risk factor modifications for cardiovascular prevention. Family history significant for heart disease in her father who I treated in the past. Overall she reports feeling fairly well. She occasionally gets headaches about twice a month of waking up in the morning but denies any symptoms of sleep apnea, generally feels rested and is not known to snore or stop breathing. Blood pressures been very well controlled on current medicine. Her recent A1c was 8.0 and she is on medications through her primary care provider. She's been working on exercise and some weight loss. She's also been traveling a lot with her husband.  04/06/2017  Savannah Wise returns today for follow-up. She seems to be doing pretty well. Recently she and her husband took a trip to [place]. They travel fairly often through there but stayed for short period of time. She has been working on diet to try to lower her hemoglobin A1c. This was tested 2 days ago, A1c is now 5.6. Fasting lipid profile shows total cholesterol 167, triglycerides 78, HDL-C 43, LDL-C 108. Goal LDL-C would be less than 70 as a diabetic however she has essentially nondiabetic based on her hemoglobin A1c with declining need for medications. She is only on low-dose metformin at this point. I think it's reasonable to continue current therapy and I would not recommend starting statin at this time as long as she remains low with regards to her blood sugars. Blood pressure recently is been running low as well and her primary care provider took her off of the HCTZ component of her valsartan. Blood pressure today was 100/66 in the office. She said she had felt dizzy but has recently felt  better.  09/24/2017  Savannah Wise today in follow-up.  She recently saw her primary care provider.  They have been traveling and she developed sinus pressure and congestion most likely has a viral infection.  She says she gets some palpitations after eating.  Of note she has been fatigued somewhat recently.  Blood pressure has run consistently low in the office.  She was on HCTZ and that was discontinued however she asked for to be restarted because of leg swelling, but really she feels more symptoms of leg heaviness and/or fatigue but notes no pitting edema.  I think the symptoms are probably due to low blood pressure.  He continues to lose weight after gastric sleeve surgery.  She is no longer diabetic.  PMHx:  Past Medical History:  Diagnosis Date  . Anemia    mild  . Diabetes mellitus without complication (HCC)    type 2 dx 2013  . Dizzy spells   . Exogenous obesity    mild  . HA (headache)    every morning  . Hypertension   . Hypertriglyceridemia   . Shortness of breath dyspnea    with walking-EKG and check up done    Past Surgical History:  Procedure Laterality Date  . Bariactric Surgery  06/27/2016  . BREAST BIOPSY Left 1991  . BREAST SURGERY Left    breast biopsy 2010  . HERNIA REPAIR  2006   abdominal  . INSERTION OF MESH N/A 04/15/2015   Procedure: INSERTION OF MESH;  Surgeon: Avel Peaceodd Rosenbower, MD;  Location: [facility];  Service:  General;  Laterality: N/A;  . LAPAROSCOPIC LYSIS OF ADHESIONS N/A 04/15/2015   Procedure: LAPAROSCOPIC LYSIS OF ADHESIONS;  Surgeon: Avel Peaceodd Rosenbower, MD;  [facility]: [facility];  Service: General;  Laterality: N/A;  . VENTRAL HERNIA REPAIR N/A 04/15/2015   Procedure: LAPAROSCOPIC RECURRENT VENTRAL HERNIA REPAIR WITH MESH;  Surgeon: Avel Peaceodd Rosenbower, MD;  [facility]: [facility];  Service: General;  Laterality: N/A;    FAMHx:  Family History  Problem Relation Age of Onset  . Hyperlipidemia Mother   . Heart disease Mother   . Hypertension Mother   . Diabetes  Mother   . Atrial fibrillation Mother   . COPD Mother   . Arthritis Father   . Hyperlipidemia Father   . Heart disease Father   . Hypertension Father   . Diabetes Father     SOCHx:   reports that  has never smoked. she has never used smokeless tobacco. She reports that she does not drink alcohol or use drugs.  ALLERGIES:  Allergies  Allergen Reactions  . Iodinated Diagnostic Agents Hives and Rash    S/P 10 MIN AFTER RECEIVING CONTRAST DEVELOPED HIVES UPPER CHEST /NECK AND LEGS , PATIENT RECEIVED 50 MG BENADRYL/MMS  . Omnipaque [Iohexol] Hives, Itching and Swelling    ROS: Pertinent items noted in HPI and remainder of comprehensive ROS otherwise negative.  HOME MEDS: Current Outpatient Medications  Medication Sig Dispense Refill  . amoxicillin-clavulanate (AUGMENTIN) 875-125 MG tablet Take 1 tablet by mouth 2 (two) times daily. 20 tablet 0  . aspirin EC 81 MG tablet Take 81 mg by mouth daily.    . Cholecalciferol (VITAMIN D) 2000 units tablet Take 1 tablet (2,000 Units total) by mouth daily. 90 tablet 3  . Naproxen Sodium (ALEVE PO) Take 2 tablets by mouth as needed (pain).     . Vitamin D, Ergocalciferol, (DRISDOL) 50000 units CAPS capsule Take 1 capsule (50,000 Units total) by mouth every 7 (seven) days. 4 capsule 5   No current facility-administered medications for this visit.     LABS/IMAGING: No results found for this or any previous visit (from the past 48 hour(s)). No results found.  WEIGHTS: Wt Readings from Last 3 Encounters:  09/24/17 155 lb 9.6 oz (70.6 kg)  09/21/17 156 lb 1.6 oz (70.8 kg)  04/06/17 158 lb 9.6 oz (71.9 kg)    VITALS: BP 96/68   Pulse (!) 59   Ht 5\' 4"  (1.626 m)   Wt 155 lb 9.6 oz (70.6 kg)   BMI 26.71 kg/m   EXAM: General appearance: alert, no distress and moderately obese Neck: no carotid bruit and no JVD Lungs: clear to auscultation bilaterally Heart: regular rate and rhythm Abdomen: soft, non-tender; bowel sounds normal; no  masses,  no organomegaly Extremities: extremities normal, atraumatic, no cyanosis or edema Pulses: 2+ and symmetric Skin: Skin color, texture, turgor normal. No rashes or lesions Neurologic: Grossly normal Psych: Pleasant  EKG: Sinus bradycardia 59-personally reviewed  ASSESSMENT: 1. Moderate obesity 2. Hypertension 3. Dyslipidemia 4. Family history of coronary disease  PLAN: 1.   Savannah Wise reports fatigue and some leg heaviness which may be related to low blood pressure.  She has had consistently low blood pressure in the office.  I like to reduce her medication as I think that she is on too much medication with her recent weight loss.  We will switch her valsartan HCTZ to valsartan 40 mg daily.  Follow-up with me in 6 months.  Chrystie NoseKenneth C. Mack Alvidrez, MD, [facility] Center[facility]ACC, FACP  [facility]  Medical Director of the [facility]  [facility]ovascular [facility] Attending Cardiologist  Direct Dial: [phone]  Fax: [phone]  Website:  [email or web address] Nicely Lavar Rosenzweig 09/24/2017, 10:03 AM

## 2017-09-24 NOTE — Patient Instructions (Signed)
Medication Instructions: START Valsartan 40 mg tablet daily. STOP Valsartan-Hydrochlorothiazide  If you need a refill on your cardiac medications before your next appointment, please call your pharmacy.   Follow-Up: Your physician wants you to follow-up in: 6 months with Dr. Rennis GoldenHilty. You will receive a reminder letter in the mail two months in advance. If you don't receive a letter, please call our office at [phone] to schedule this follow-up appointment.   Thank you for choosing [facility]!!

## 2017-09-27 ENCOUNTER — Other Ambulatory Visit: Payer: Self-pay | Admitting: Family Medicine

## 2017-09-27 DIAGNOSIS — H2513 Age-related nuclear cataract, bilateral: Secondary | ICD-10-CM | POA: Diagnosis not present

## 2017-09-27 DIAGNOSIS — H524 Presbyopia: Secondary | ICD-10-CM | POA: Diagnosis not present

## 2017-09-27 DIAGNOSIS — H353132 Nonexudative age-related macular degeneration, bilateral, intermediate dry stage: Secondary | ICD-10-CM | POA: Diagnosis not present

## 2017-09-27 DIAGNOSIS — H40013 Open angle with borderline findings, low risk, bilateral: Secondary | ICD-10-CM | POA: Diagnosis not present

## 2017-09-27 DIAGNOSIS — E119 Type 2 diabetes mellitus without complications: Secondary | ICD-10-CM | POA: Diagnosis not present

## 2017-09-27 LAB — HM DIABETES EYE EXAM

## 2017-10-08 ENCOUNTER — Encounter: Payer: Self-pay | Admitting: Family Medicine

## 2018-04-10 ENCOUNTER — Ambulatory Visit: Payer: BLUE CROSS/BLUE SHIELD | Admitting: Family Medicine

## 2018-04-10 VITALS — BP 122/62 | HR 61 | Temp 98.6°F | Wt 159.6 lb

## 2018-04-10 DIAGNOSIS — E559 Vitamin D deficiency, unspecified: Secondary | ICD-10-CM | POA: Diagnosis not present

## 2018-04-10 DIAGNOSIS — E781 Pure hyperglyceridemia: Secondary | ICD-10-CM | POA: Diagnosis not present

## 2018-04-10 DIAGNOSIS — L819 Disorder of pigmentation, unspecified: Secondary | ICD-10-CM | POA: Diagnosis not present

## 2018-04-10 DIAGNOSIS — I10 Essential (primary) hypertension: Secondary | ICD-10-CM

## 2018-04-10 DIAGNOSIS — E119 Type 2 diabetes mellitus without complications: Secondary | ICD-10-CM

## 2018-04-10 DIAGNOSIS — Z1159 Encounter for screening for other viral diseases: Secondary | ICD-10-CM | POA: Diagnosis not present

## 2018-04-10 LAB — VITAMIN D 25 HYDROXY (VIT D DEFICIENCY, FRACTURES): VITD: 22.82 ng/mL — ABNORMAL LOW (ref 30.00–100.00)

## 2018-04-10 LAB — BASIC METABOLIC PANEL
BUN: 18 mg/dL (ref 6–23)
CHLORIDE: 103 meq/L (ref 96–112)
CO2: 29 mEq/L (ref 19–32)
CREATININE: 0.57 mg/dL (ref 0.40–1.20)
Calcium: 9.3 mg/dL (ref 8.4–10.5)
GFR: 120.01 mL/min (ref >60.00)
GLUCOSE: 97 mg/dL (ref 70–99)
POTASSIUM: 4.7 meq/L (ref 3.5–5.1)
Sodium: 139 mEq/L (ref 135–145)

## 2018-04-10 LAB — HEPATIC FUNCTION PANEL
ALK PHOS: 51 U/L (ref 39–117)
ALT: 8 U/L (ref 0–35)
AST: 9 U/L (ref 0–37)
Albumin: 4.3 g/dL (ref 3.5–5.2)
BILIRUBIN DIRECT: 0.1 mg/dL (ref 0.0–0.3)
BILIRUBIN TOTAL: 0.4 mg/dL (ref 0.2–1.2)
Total Protein: 6.7 g/dL (ref 6.0–8.3)

## 2018-04-10 LAB — LIPID PANEL
CHOLESTEROL: 178 mg/dL (ref 0–200)
HDL: 52.1 mg/dL (ref >39.00)
LDL CALC: 104 mg/dL — AB (ref 0–99)
NonHDL: 125.48
Total CHOL/HDL Ratio: 3
Triglycerides: 108 mg/dL (ref 0.0–149.0)
VLDL: 21.6 mg/dL (ref 0.0–40.0)

## 2018-04-10 LAB — POCT GLYCOSYLATED HEMOGLOBIN (HGB A1C): HEMOGLOBIN A1C: 5.9 % — AB (ref 4.0–5.6)

## 2018-04-10 NOTE — Progress Notes (Signed)
Subjective:     Patient ID: Savannah Wise, female   DOB: 02/04/1971, 47 y.o.   MRN: [id number]  HPI Patient here for medical follow-up. She and her husband are here from [place] visiting with family. They come usually twice per year. She has history of morbid obesity and had gastric sleeve surgery over year ago and has done extremely well since that time. She was able to come off several medications. Only current medication is valsartan 40 mg daily. She also takes vitamin D supplement. She has history of low vitamin D. Her level last year was 21.  She has history of hyperlipidemia but lipids improved following her surgery. She would like follow-up lipids today. Also requesting hepatitis C antibody. No specific risk factors.  She has some hyperpigmentation across her upper back. Progressive for several years. Nonpruritic. No involvement of extremities.  History of type 2 diabetes. She was able to come off all medications after her surgery. She's doing fairly good job with weight maintenance. Exercises regularly. Has made positive dietary changes since her surgery  Wt Readings from Last 3 Encounters:  04/10/18 159 lb 9.6 oz (72.4 kg)  09/24/17 155 lb 9.6 oz (70.6 kg)  09/21/17 156 lb 1.6 oz (70.8 kg)     Past Medical History:  Diagnosis Date  . Anemia    mild  . Diabetes mellitus without complication (HCC)    type 2 dx 2013  . Dizzy spells   . Exogenous obesity    mild  . HA (headache)    every morning  . Hypertension   . Hypertriglyceridemia   . Shortness of breath dyspnea    with walking-EKG and check up done   Past Surgical History:  Procedure Laterality Date  . Bariactric Surgery  06/27/2016  . BREAST BIOPSY Left 1991  . BREAST SURGERY Left    breast biopsy 2010  . HERNIA REPAIR  2006   abdominal  . INSERTION OF MESH N/A 04/15/2015   Procedure: INSERTION OF MESH;  Surgeon: Avel Peaceodd Rosenbower, MD;  Location: [facility];  Service: General;  Laterality: N/A;  . LAPAROSCOPIC  LYSIS OF ADHESIONS N/A 04/15/2015   Procedure: LAPAROSCOPIC LYSIS OF ADHESIONS;  Surgeon: Avel Peaceodd Rosenbower, MD;  [facility]: [facility];  Service: General;  Laterality: N/A;  . VENTRAL HERNIA REPAIR N/A 04/15/2015   Procedure: LAPAROSCOPIC RECURRENT VENTRAL HERNIA REPAIR WITH MESH;  Surgeon: Avel Peaceodd Rosenbower, MD;  Location: [facility];  Service: General;  Laterality: N/A;    reports that she has never smoked. She has never used smokeless tobacco. She reports that she does not drink alcohol or use drugs. family history includes Arthritis in her father; Atrial fibrillation in her mother; COPD in her mother; Diabetes in her father and mother; Heart disease in her father and mother; Hyperlipidemia in her father and mother; Hypertension in her father and mother. Allergies  Allergen Reactions  . Iodinated Diagnostic Agents Hives and Rash    S/P 10 MIN AFTER RECEIVING CONTRAST DEVELOPED HIVES UPPER CHEST /NECK AND LEGS , PATIENT RECEIVED 50 MG BENADRYL/MMS  . Omnipaque [Iohexol] Hives, Itching and Swelling     Review of Systems  Constitutional: Negative for fatigue.  Eyes: Negative for visual disturbance.  Respiratory: Negative for cough, chest tightness, shortness of breath and wheezing.   Cardiovascular: Negative for chest pain, palpitations and leg swelling.  Gastrointestinal: Negative for abdominal pain.  Skin: Positive for rash.  Neurological: Negative for dizziness, seizures, syncope, weakness, light-headedness and headaches.  Objective:   Physical Exam  Constitutional: She appears well-developed and well-nourished.  Eyes: Pupils are equal, round, and reactive to light.  Neck: Neck supple. No JVD present. No thyromegaly present.  Cardiovascular: Normal rate and regular rhythm. Exam reveals no gallop.  Pulmonary/Chest: Effort normal and breath sounds normal. No respiratory distress. She has no wheezes. She has no rales.  Musculoskeletal: She exhibits no edema.  Neurological: She is alert.   Skin:  Patient has some nonscaly fairly diffuse hyperpigmentation across her upper back and posterior neck region       Assessment:     #1 hypertension stable and at goal  #2 has history of type 2 diabetes controlled with weight loss. Hemoglobin A1c today stable 5.9%  #3 history of low vitamin D  #4 history of hyperlipidemia currently off statin medication  #5 benign-appearing hyperpigmentation upper back    Plan:     -Obtain further labs including 25-hydroxy vitamin D level, lipid panel, hepatic panel, hepatitis C antibody -We explained that topicals would not likely help her upper back hyperpigmentation. We offered dermatology referral and at this point she wishes to observe. -Continue weight control efforts -Routine follow-up in 6 months and sooner as needed  Kristian Covey MD [facility]

## 2018-04-11 LAB — HEPATITIS C ANTIBODY
Hepatitis C Ab: NONREACTIVE
SIGNAL TO CUT-OFF: 0.01 (ref <1.00)

## 2018-04-11 MED ORDER — VITAMIN D (ERGOCALCIFEROL) 1.25 MG (50000 UNIT) PO CAPS: ORAL_CAPSULE | ORAL | 1 refills | Status: AC

## 2018-04-11 NOTE — Addendum Note (Signed)
Addended by: Johnella MoloneyFUNDERBURK, JO A on: 04/11/2018 05:52 PM   Modules accepted: Orders

## 2018-04-22 ENCOUNTER — Ambulatory Visit: Payer: BLUE CROSS/BLUE SHIELD | Admitting: Family Medicine

## 2018-05-03 ENCOUNTER — Ambulatory Visit: Payer: BLUE CROSS/BLUE SHIELD | Admitting: Internal Medicine

## 2018-08-29 ENCOUNTER — Other Ambulatory Visit: Payer: Self-pay | Admitting: Obstetrics & Gynecology

## 2018-08-29 DIAGNOSIS — R921 Mammographic calcification found on diagnostic imaging of breast: Secondary | ICD-10-CM

## 2018-09-19 ENCOUNTER — Ambulatory Visit
Admission: RE | Admit: 2018-09-19 | Discharge: 2018-09-19 | Disposition: A | Discharge status: Home / Self Care | Payer: BLUE CROSS/BLUE SHIELD | Source: Ambulatory Visit | Attending: Obstetrics & Gynecology | Admitting: Obstetrics & Gynecology

## 2018-09-19 DIAGNOSIS — R921 Mammographic calcification found on diagnostic imaging of breast: Secondary | ICD-10-CM

## 2018-09-20 ENCOUNTER — Other Ambulatory Visit: Payer: Self-pay

## 2018-09-20 ENCOUNTER — Ambulatory Visit: Payer: BLUE CROSS/BLUE SHIELD | Admitting: Family Medicine

## 2018-09-20 ENCOUNTER — Encounter: Payer: Self-pay | Admitting: Family Medicine

## 2018-09-20 VITALS — BP 88/50 | HR 74 | Temp 98.8°F | Ht 63.0 in | Wt 156.0 lb

## 2018-09-20 DIAGNOSIS — Z23 Encounter for immunization: Secondary | ICD-10-CM | POA: Diagnosis not present

## 2018-09-20 DIAGNOSIS — I10 Essential (primary) hypertension: Secondary | ICD-10-CM | POA: Diagnosis not present

## 2018-09-20 DIAGNOSIS — Z9884 Bariatric surgery status: Secondary | ICD-10-CM | POA: Insufficient documentation

## 2018-09-20 DIAGNOSIS — E119 Type 2 diabetes mellitus without complications: Secondary | ICD-10-CM

## 2018-09-20 DIAGNOSIS — Z0000 Encounter for general adult medical examination without abnormal findings: Secondary | ICD-10-CM

## 2018-09-20 NOTE — Progress Notes (Signed)
Subjective:     Patient ID: Savannah Wise, female   DOB: 07/31/1971, 47 y.o.   MRN: [id number]  HPI   Patient here for physical.  She actually sees GYN and she just had mammogram but plans to see GYN next week.  She has past history of obesity, hypertension, hyperlipidemia, type 2 diabetes.  She had gastric bypass surgery October 2017 and has lost a tremendous amount of weight.  Her weight is down 3 more pounds from last visit here.  She has had some recent issues with dizziness which sounds more like lightheadedness and worse with position change.  No syncope.  She has had some increased fatigue.  She is sleeping well.  Decreased exercise capacity.  No family history of colon cancer.  She will turn 48 next year.  Immunizations up-to-date.  Patient drinks fluids but mostly in the form of caffeinated tea and caffeinated diet Pepsi's- sometimes several per day.  Past Medical History:  Diagnosis Date  . Anemia    mild  . Diabetes mellitus without complication (HCC)    type 2 dx 2013  . Dizzy spells   . Exogenous obesity    mild  . HA (headache)    every morning  . Hypertension   . Hypertriglyceridemia   . Shortness of breath dyspnea    with walking-EKG and check up done   Past Surgical History:  Procedure Laterality Date  . Bariactric Surgery  06/27/2016  . BREAST BIOPSY Left 1991  . BREAST SURGERY Left    breast biopsy 2010  . HERNIA REPAIR  2006   abdominal  . INSERTION OF MESH N/A 04/15/2015   Procedure: INSERTION OF MESH;  Surgeon: Avel Peaceodd Rosenbower, MD;  Location: [facility];  Service: General;  Laterality: N/A;  . LAPAROSCOPIC LYSIS OF ADHESIONS N/A 04/15/2015   Procedure: LAPAROSCOPIC LYSIS OF ADHESIONS;  Surgeon: Avel Peaceodd Rosenbower, MD;  [facility]: [facility];  Service: General;  Laterality: N/A;  . VENTRAL HERNIA REPAIR N/A 04/15/2015   Procedure: LAPAROSCOPIC RECURRENT VENTRAL HERNIA REPAIR WITH MESH;  Surgeon: Avel Peaceodd Rosenbower, MD;  Location: [facility];  Service: General;  Laterality: N/A;     reports that she has never smoked. She has never used smokeless tobacco. She reports that she does not drink alcohol or use drugs. family history includes Arthritis in her father; Atrial fibrillation in her mother; COPD in her mother; Diabetes in her father and mother; Heart disease in her father and mother; Hyperlipidemia in her father and mother; Hypertension in her father and mother. Allergies  Allergen Reactions  . Iodinated Diagnostic Agents Hives and Rash    S/P 10 MIN AFTER RECEIVING CONTRAST DEVELOPED HIVES UPPER CHEST /NECK AND LEGS , PATIENT RECEIVED 50 MG BENADRYL/MMS  . Omnipaque [Iohexol] Hives, Itching and Swelling     Review of Systems  Constitutional: Positive for fatigue. Negative for activity change, appetite change, chills, fever and unexpected weight change.  HENT: Negative for ear pain, hearing loss, sore throat and trouble swallowing.   Eyes: Negative for visual disturbance.  Respiratory: Negative for cough and shortness of breath.   Cardiovascular: Negative for chest pain and palpitations.  Gastrointestinal: Negative for abdominal pain, blood in stool, constipation and diarrhea.  Genitourinary: Negative for dysuria and hematuria.  Musculoskeletal: Negative for arthralgias, back pain and myalgias.  Skin: Negative for rash.  Neurological: Positive for dizziness. Negative for syncope, speech difficulty and headaches.  Hematological: Negative for adenopathy.  Psychiatric/Behavioral: Negative for confusion and dysphoric mood.  Objective:   Physical Exam Constitutional:      Appearance: She is well-developed.  HENT:     Head: Normocephalic and atraumatic.  Eyes:     Pupils: Pupils are equal, round, and reactive to light.  Neck:     Musculoskeletal: Normal range of motion and neck supple.     Thyroid: No thyromegaly.  Cardiovascular:     Rate and Rhythm: Normal rate and regular rhythm.     Heart sounds: Normal heart sounds. No murmur.  Pulmonary:      Effort: No respiratory distress.     Breath sounds: Normal breath sounds. No wheezing or rales.  Abdominal:     Palpations: Abdomen is soft. There is no mass.     Tenderness: There is no abdominal tenderness.  Musculoskeletal: Normal range of motion.  Lymphadenopathy:     Cervical: No cervical adenopathy.  Skin:    Findings: No rash.  Neurological:     Mental Status: She is alert and oriented to person, place, and time.     Cranial Nerves: No cranial nerve deficit.     Deep Tendon Reflexes: Reflexes normal.  Psychiatric:        Behavior: Behavior normal.        Thought Content: Thought content normal.        Judgment: Judgment normal.        Assessment:     Physical exam.  Patient presents with several week history of increased dizziness and fatigue.  Sounds like her lightheadedness is more positional related.  Seated and standing blood pressure today 88/50    Plan:     -Hold valsartan for now -Gradually reduce caffeine intake -Ordered full set of labs and she will return Monday for that. -We will have her stop and get blood pressure reassessed at follow-up Monday -Increase fluid intake  Kristian CoveyBruce W Woody Kronberg MD [facility]

## 2018-09-20 NOTE — Patient Instructions (Signed)
HOLD Valsartan  Drink plenty of water  Gradually reduce caffeine intake.

## 2018-09-23 ENCOUNTER — Other Ambulatory Visit (INDEPENDENT_AMBULATORY_CARE_PROVIDER_SITE_OTHER): Payer: BLUE CROSS/BLUE SHIELD

## 2018-09-23 DIAGNOSIS — E119 Type 2 diabetes mellitus without complications: Secondary | ICD-10-CM

## 2018-09-23 DIAGNOSIS — I10 Essential (primary) hypertension: Secondary | ICD-10-CM

## 2018-09-23 DIAGNOSIS — Z0000 Encounter for general adult medical examination without abnormal findings: Secondary | ICD-10-CM | POA: Diagnosis not present

## 2018-09-23 LAB — CBC WITH DIFFERENTIAL/PLATELET
BASOS ABS: 0.1 10*3/uL (ref 0.0–0.1)
Basophils Relative: 0.9 % (ref 0.0–3.0)
Eosinophils Absolute: 0.3 10*3/uL (ref 0.0–0.7)
Eosinophils Relative: 3.9 % (ref 0.0–5.0)
HCT: 32.3 % — ABNORMAL LOW (ref 36.0–46.0)
Hemoglobin: 9.7 g/dL — ABNORMAL LOW (ref 12.0–15.0)
Lymphocytes Relative: 28.1 % (ref 12.0–46.0)
Lymphs Abs: 1.9 10*3/uL (ref 0.7–4.0)
MCHC: 30.1 g/dL (ref 30.0–36.0)
MCV: 64.9 fl — ABNORMAL LOW (ref 78.0–100.0)
Monocytes Absolute: 0.4 10*3/uL (ref 0.1–1.0)
Monocytes Relative: 5.5 % (ref 3.0–12.0)
Neutro Abs: 4.1 10*3/uL (ref 1.4–7.7)
Neutrophils Relative %: 61.6 % (ref 43.0–77.0)
Platelets: 346 10*3/uL (ref 150.0–400.0)
RBC: 4.97 Mil/uL (ref 3.87–5.11)
RDW: 14.5 % (ref 11.5–15.5)
WBC: 6.7 10*3/uL (ref 4.0–10.5)

## 2018-09-23 LAB — BASIC METABOLIC PANEL
BUN: 21 mg/dL (ref 6–23)
CO2: 27 mEq/L (ref 19–32)
Calcium: 8.9 mg/dL (ref 8.4–10.5)
Chloride: 105 mEq/L (ref 96–112)
Creatinine, Ser: 0.63 mg/dL (ref 0.40–1.20)
GFR: 106.72 mL/min (ref >60.00)
GLUCOSE: 96 mg/dL (ref 70–99)
Potassium: 4.4 mEq/L (ref 3.5–5.1)
Sodium: 140 mEq/L (ref 135–145)

## 2018-09-23 LAB — VITAMIN D 25 HYDROXY (VIT D DEFICIENCY, FRACTURES): VITD: 27.71 ng/mL — ABNORMAL LOW (ref 30.00–100.00)

## 2018-09-23 LAB — LIPID PANEL
Cholesterol: 164 mg/dL (ref 0–200)
HDL: 51.3 mg/dL (ref >39.00)
LDL Cholesterol: 96 mg/dL (ref 0–99)
NonHDL: 112.4
Total CHOL/HDL Ratio: 3
Triglycerides: 84 mg/dL (ref 0.0–149.0)
VLDL: 16.8 mg/dL (ref 0.0–40.0)

## 2018-09-23 LAB — HEPATIC FUNCTION PANEL
ALT: 7 U/L (ref 0–35)
AST: 9 U/L (ref 0–37)
Albumin: 4.1 g/dL (ref 3.5–5.2)
Alkaline Phosphatase: 58 U/L (ref 39–117)
BILIRUBIN DIRECT: 0.1 mg/dL (ref 0.0–0.3)
TOTAL PROTEIN: 6.2 g/dL (ref 6.0–8.3)
Total Bilirubin: 0.3 mg/dL (ref 0.2–1.2)

## 2018-09-23 LAB — HEMOGLOBIN A1C: Hgb A1c MFr Bld: 6.2 % (ref 4.6–6.5)

## 2018-09-23 LAB — TSH: TSH: 1.58 u[IU]/mL (ref 0.35–4.50)

## 2018-09-27 ENCOUNTER — Encounter: Payer: Self-pay | Admitting: Family Medicine

## 2018-09-27 ENCOUNTER — Encounter: Payer: Self-pay | Admitting: Physician Assistant

## 2018-09-27 ENCOUNTER — Ambulatory Visit: Payer: BLUE CROSS/BLUE SHIELD | Admitting: Physician Assistant

## 2018-09-27 VITALS — BP 103/62 | HR 60 | Ht 63.0 in | Wt 163.6 lb

## 2018-09-27 DIAGNOSIS — I10 Essential (primary) hypertension: Secondary | ICD-10-CM

## 2018-09-27 DIAGNOSIS — H40013 Open angle with borderline findings, low risk, bilateral: Secondary | ICD-10-CM | POA: Diagnosis not present

## 2018-09-27 DIAGNOSIS — R002 Palpitations: Secondary | ICD-10-CM | POA: Diagnosis not present

## 2018-09-27 DIAGNOSIS — E119 Type 2 diabetes mellitus without complications: Secondary | ICD-10-CM | POA: Diagnosis not present

## 2018-09-27 DIAGNOSIS — R7303 Prediabetes: Secondary | ICD-10-CM

## 2018-09-27 DIAGNOSIS — H2513 Age-related nuclear cataract, bilateral: Secondary | ICD-10-CM | POA: Diagnosis not present

## 2018-09-27 DIAGNOSIS — H353132 Nonexudative age-related macular degeneration, bilateral, intermediate dry stage: Secondary | ICD-10-CM | POA: Diagnosis not present

## 2018-09-27 DIAGNOSIS — D649 Anemia, unspecified: Secondary | ICD-10-CM

## 2018-09-27 DIAGNOSIS — E785 Hyperlipidemia, unspecified: Secondary | ICD-10-CM

## 2018-09-27 LAB — HM DIABETES EYE EXAM

## 2018-09-27 MED ORDER — METOPROLOL SUCCINATE ER 25 MG PO TB24: ORAL_TABLET | ORAL | 3 refills | Status: DC

## 2018-09-27 NOTE — Progress Notes (Signed)
Cardiology Office Note    Date:  09/27/2018   ID:  Savannah Wise, DOB 26-Jan-1971, MRN [id number]  PCP:  Kristian Covey, MD  Cardiologist:  Dr. Rennis Golden  Chief Complaint  Patient presents with  . Follow-up    seen for Dr. Rennis Golden.     History of Present Illness:  Savannah Wise is a 48 y.o. female with PMH of HTN, obesity, HLD, and prediabetes. She has significant family history of CAD.  Most recently, patient was seen by her PCP on 09/20/2018, her blood pressure was low, therefore valsartan was held.  Lab work obtained on 09/23/2018 showed normal CMP, well-controlled cholesterol, hemoglobin borderline low at 9.7 which is decreased compared to the previous lab work.  Patient presents today for cardiology office visit.  She denies any recent chest pain.  Although she says she sometimes feel bloated, I do not see any sign of volume overload on physical exam.  She did take another dose of valsartan last night, her blood pressure today is 103/62.  She occasionally has some dizziness and blurred vision.  Her main complaint today is palpitation after food.  Symptom seems to occur on a daily basis.  She has a history of gastric sleeve in the past.  Recently, she started noticing fluttering sensation in her chest lasting up to 45 minutes after food.  The symptom is not associated with exertion.  I will take her off of the valsartan and add Toprol-XL 12.5 mg daily as needed.  Ideally she will need an echocardiogram and 48-hour Holter monitor.  However both the patient and her husband are leaving for [place] on 10/04/2018 and will not return to [place] until June or July 2020.  I would do a trial on the medication for now, however if symptoms persist on follow-up, I would highly recommend heart monitor and a echocardiogram.    Past Medical History:  Diagnosis Date  . Anemia    mild  . Diabetes mellitus without complication (HCC)    type 2 dx 2013  . Dizzy spells   . Exogenous obesity    mild    . HA (headache)    every morning  . Hypertension   . Hypertriglyceridemia   . Shortness of breath dyspnea    with walking-EKG and check up done    Past Surgical History:  Procedure Laterality Date  . Bariactric Surgery  06/27/2016  . BREAST BIOPSY Left 1991  . BREAST SURGERY Left    breast biopsy 2010  . HERNIA REPAIR  2006   abdominal  . INSERTION OF MESH N/A 04/15/2015   Procedure: INSERTION OF MESH;  Surgeon: Avel Peace, MD;  Location: [facility];  Service: General;  Laterality: N/A;  . LAPAROSCOPIC LYSIS OF ADHESIONS N/A 04/15/2015   Procedure: LAPAROSCOPIC LYSIS OF ADHESIONS;  Surgeon: Avel Peace, MD;  Location: [facility];  Service: General;  Laterality: N/A;  . VENTRAL HERNIA REPAIR N/A 04/15/2015   Procedure: LAPAROSCOPIC RECURRENT VENTRAL HERNIA REPAIR WITH MESH;  Surgeon: Avel Peace, MD;  Location: [facility];  Service: General;  Laterality: N/A;    Current Medications: Outpatient Medications Prior to Visit  Medication Sig Dispense Refill  . aspirin EC 81 MG tablet Take 81 mg by mouth daily.    . Cholecalciferol (VITAMIN D) 2000 units tablet Take 1 tablet (2,000 Units total) by mouth daily. 90 tablet 3  . Naproxen Sodium (ALEVE PO) Take 2 tablets by mouth as needed (pain).     Marland Kitchen  Vitamin D, Ergocalciferol, (DRISDOL) 50000 units CAPS capsule TAKE ONE CAPSULE BY MOUTH EVERY 7 DAYS. 12 capsule 1  . valsartan (DIOVAN) 40 MG tablet Take 1 tablet (40 mg total) by mouth daily. (Patient taking differently: Take 80 mg by mouth daily. ) 30 tablet 5   No facility-administered medications prior to visit.      Allergies:   Iodinated diagnostic agents and Omnipaque [iohexol]   Social History   Socioeconomic History  . Marital status: Married    Spouse name: Not on file  . Number of children: Not on file  . Years of education: Not on file  . Highest education level: Not on file  Occupational History  . Not on file  Social Needs  . Financial resource strain: Not on file   . Food insecurity:    Worry: Not on file    Inability: Not on file  . Transportation needs:    Medical: Not on file    Non-medical: Not on file  Tobacco Use  . Smoking status: Never Smoker  . Smokeless tobacco: Never Used  Substance and Sexual Activity  . Alcohol use: No  . Drug use: No  . Sexual activity: Not on file  Lifestyle  . Physical activity:    Days per week: Not on file    Minutes per session: Not on file  . Stress: Not on file  Relationships  . Social connections:    Talks on phone: Not on file    Gets together: Not on file    Attends religious service: Not on file    Active member of club or organization: Not on file    Attends meetings of clubs or organizations: Not on file    Relationship status: Not on file  Other Topics Concern  . Not on file  Social History Narrative   epworth sleepiness scale = 11 (05/01/16)     Family History:  The patient's family history includes Arthritis in her father; Atrial fibrillation in her mother; COPD in her mother; Diabetes in her father and mother; Heart disease in her father and mother; Hyperlipidemia in her father and mother; Hypertension in her father and mother.   ROS:   Please see the history of present illness.    ROS All other systems reviewed and are negative.   PHYSICAL EXAM:   VS:  BP 103/62   Pulse 60   Ht 5\' 3"  (1.6 m)   Wt 163 lb 9.6 oz (74.2 kg)   BMI 28.98 kg/m    GEN: Well nourished, well developed, in no acute distress  HEENT: normal  Neck: no JVD, carotid bruits, or masses Cardiac: RRR; no murmurs, rubs, or gallops,no edema  Respiratory:  clear to auscultation bilaterally, normal work of breathing GI: soft, nontender, nondistended, + BS MS: no deformity or atrophy  Skin: warm and dry, no rash Neuro:  Alert and Oriented x 3, Strength and sensation are intact Psych: euthymic mood, full affect  Wt Readings from Last 3 Encounters:  09/27/18 163 lb 9.6 oz (74.2 kg)  09/20/18 156 lb (70.8 kg)   04/10/18 159 lb 9.6 oz (72.4 kg)      Studies/Labs Reviewed:   EKG:  EKG is ordered today.  The ekg ordered today demonstrates normal sinus rhythm with PVCs  Recent Labs: 09/23/2018: ALT 7; BUN 21; Creatinine, Ser 0.63; Hemoglobin 9.7; Platelets 346.0; Potassium 4.4; Sodium 140; TSH 1.58   Lipid Panel    Component Value Date/Time   CHOL 164 09/23/2018  0806   TRIG 84.0 09/23/2018 0806   HDL 51.30 09/23/2018 0806   CHOLHDL 3 09/23/2018 0806   VLDL 16.8 09/23/2018 0806   LDLCALC 96 09/23/2018 0806   LDLDIRECT 63.9 04/15/2014 1029    Additional studies/ records that were reviewed today include:   N/A   ASSESSMENT:    1. Palpitations   2. Anemia, unspecified type   3. Essential hypertension   4. Hyperlipidemia LDL goal <100   5. Prediabetes      PLAN:  In order of problems listed above:  1. Palpitation: Interestingly, symptoms typically occur after food and last about 45 minutes.  She feels her heart was racing.  Unfortunately her blood pressure is borderline.  I recommended as needed Toprol-XL 12.5 mg daily.  I will take her off of valsartan.  Ideally she will need 48-hour Holter monitor and a echocardiogram, however patient is leaving for [place] in 7 days.  I plan to bring the patient back once she returns from her trip, if symptoms still persist at that time, we will then order the heart monitor and echocardiogram.  2. Anemia: Recent hemoglobin 9.7 which has dropped by 2 units compared to previous hemoglobin.  Her primary care provider has ordered additional lab work and a Hemoccult stool card  3. Hypertension: Blood pressure actually has been borderline low recently, valsartan was held by her PCP.  4. Hyperlipidemia: Diet controlled.  Recent lab work shows stable cholesterol.  LDL borderline at 96.  5. Prediabetes: Hemoglobin A1c 6.2.    Medication Adjustments/Labs and Tests Ordered: Current medicines are reviewed at length with the patient today.   Concerns regarding medicines are outlined above.  Medication changes, Labs and Tests ordered today are listed in the Patient Instructions below. There are no Patient Instructions on file for this visit.   Ramond DialSigned, Laloni Rowton, [place]  09/27/2018 9:09 AM    [facility] [address], [place], [place]  [address] Phone: [phone]; Fax: [phone]

## 2018-09-27 NOTE — Patient Instructions (Signed)
Medication Instructions:   Stop Valsartan Start Metoprolol Succinate 25 mg 1/2 tablet daily if needed for palpitations  If you need a refill on your cardiac medications before your next appointment, please call your pharmacy.   Lab work:  Call Dr.Burchette's office to have Anemia Panel and Stool Card   Testing/Procedures:  If symptoms continue when gets back from trip call office to schedule 48 hour Holter Monitor and Echo  Follow-Up: At [facility], you and your health needs are our priority.  As part of our continuing mission to provide you with exceptional heart care, we have created designated Provider Care Teams.  These Care Teams include your primary Cardiologist (physician) and Advanced Practice Providers (APPs -  Physician Assistants and Nurse Practitioners) who all work together to provide you with the care you need, when you need it. . Follow Up with Dr.Hilty in July or August Call 3 months before to schedule appointment   Avoid spicy foods Avoid foods high in acid

## 2018-09-30 ENCOUNTER — Ambulatory Visit (INDEPENDENT_AMBULATORY_CARE_PROVIDER_SITE_OTHER): Payer: BLUE CROSS/BLUE SHIELD | Admitting: Family Medicine

## 2018-09-30 ENCOUNTER — Encounter: Payer: Self-pay | Admitting: Family Medicine

## 2018-09-30 ENCOUNTER — Other Ambulatory Visit: Payer: Self-pay

## 2018-09-30 VITALS — BP 116/78 | HR 85 | Temp 98.5°F | Ht 63.0 in | Wt 165.0 lb

## 2018-09-30 DIAGNOSIS — Z9884 Bariatric surgery status: Secondary | ICD-10-CM | POA: Diagnosis not present

## 2018-09-30 DIAGNOSIS — D563 Thalassemia minor: Secondary | ICD-10-CM

## 2018-09-30 DIAGNOSIS — D509 Iron deficiency anemia, unspecified: Secondary | ICD-10-CM

## 2018-09-30 LAB — FERRITIN: Ferritin: 3.3 ng/mL — ABNORMAL LOW (ref 10.0–291.0)

## 2018-09-30 LAB — VITAMIN B12: Vitamin B-12: 442 pg/mL (ref 211–911)

## 2018-09-30 NOTE — Progress Notes (Signed)
Subjective:     Patient ID: Savannah Wise, female   DOB: 23-Nov-1970, 48 y.o.   MRN: [id number]  HPI Patient was seen recently for physical.  She had complained of some increased fatigue and some lightheadedness.  Hemoglobin came back 9.7 with MCV of 64.9.  She does have history of known thalassemia minor.  However, her hemoglobin did drop from 11.8 last time this was checked to 9.7.  She does have history of gastric sleeve surgery and she states she had EGD about a month ago when she was in [place] but no evidence for any blood loss seen.  She has never had colonoscopy.  She does have fairly heavy menses monthly.  She has not seen her GYN recently.  She is not taking any iron supplements.  Past Medical History:  Diagnosis Date  . Anemia    mild  . Diabetes mellitus without complication (HCC)    type 2 dx 2013  . Dizzy spells   . Exogenous obesity    mild  . HA (headache)    every morning  . Hypertension   . Hypertriglyceridemia   . Shortness of breath dyspnea    with walking-EKG and check up done   Past Surgical History:  Procedure Laterality Date  . Bariactric Surgery  06/27/2016  . BREAST BIOPSY Left 1991  . BREAST SURGERY Left    breast biopsy 2010  . HERNIA REPAIR  2006   abdominal  . INSERTION OF MESH N/A 04/15/2015   Procedure: INSERTION OF MESH;  Surgeon: Avel Peace, MD;  Location: [facility];  Service: General;  Laterality: N/A;  . LAPAROSCOPIC LYSIS OF ADHESIONS N/A 04/15/2015   Procedure: LAPAROSCOPIC LYSIS OF ADHESIONS;  Surgeon: Avel Peace, MD;  Location: [facility];  Service: General;  Laterality: N/A;  . VENTRAL HERNIA REPAIR N/A 04/15/2015   Procedure: LAPAROSCOPIC RECURRENT VENTRAL HERNIA REPAIR WITH MESH;  Surgeon: Avel Peace, MD;  Location: [facility];  Service: General;  Laterality: N/A;    reports that she has never smoked. She has never used smokeless tobacco. She reports that she does not drink alcohol or use drugs. family history includes Arthritis in  her father; Atrial fibrillation in her mother; COPD in her mother; Diabetes in her father and mother; Heart disease in her father and mother; Hyperlipidemia in her father and mother; Hypertension in her father and mother. Allergies  Allergen Reactions  . Iodinated Diagnostic Agents Hives and Rash    S/P 10 MIN AFTER RECEIVING CONTRAST DEVELOPED HIVES UPPER CHEST /NECK AND LEGS , PATIENT RECEIVED 50 MG BENADRYL/MMS  . Omnipaque [Iohexol] Hives, Itching and Swelling     Review of Systems  Constitutional: Positive for fatigue. Negative for appetite change and unexpected weight change.  Respiratory: Negative for shortness of breath.   Gastrointestinal: Negative for abdominal pain.  Neurological: Positive for light-headedness.       Objective:   Physical Exam Constitutional:      Appearance: Normal appearance.  Cardiovascular:     Rate and Rhythm: Normal rate and regular rhythm.  Pulmonary:     Effort: Pulmonary effort is normal.     Breath sounds: Normal breath sounds.  Genitourinary:    Comments: Rectal exam reveals no rectal mass.  Brown Hemoccult negative stool Neurological:     Mental Status: She is alert.        Assessment:     Microcytic anemia.  Known history of thalassemia minor but decline in hemoglobin below range normally seen with thalassemia  and reduced from her prior baseline.  She has fairly heavy menses which is the most possible source of her loss.  Also history of gastric surgery which puts her at increased risk.  We cannot assume that her drop in hemoglobin is from menstrual loss at this time.    Plan:     -Check Hemoccults -Check further labs with ferritin, TIBC, serum iron, B12 level -If Hemoccult is positive will recommend GI referral for consideration for colonoscopy.  If negative will focus on iron replacement and close follow-up.  She will be returning to [place] end of this week. -Consider going ahead and starting over-the-counter iron sulfate 1  daily in the meantime  Kristian Covey MD [facility]

## 2018-09-30 NOTE — Patient Instructions (Signed)
Iron Deficiency Anemia, Adult  Iron deficiency anemia is a condition in which the concentration of red blood cells or hemoglobin in the blood is below normal because of too little iron. Hemoglobin is a substance in red blood cells that carries oxygen to the body's tissues. When the concentration of red blood cells or hemoglobin is too low, not enough oxygen reaches these tissues.  Iron deficiency anemia is usually long-lasting (chronic) and it develops over time. It may or may not cause symptoms. It is a common type of anemia.  What are the causes?  This condition may be caused by:   Not enough iron in the diet.   Blood loss caused by bleeding in the intestine.   Blood loss from a gastrointestinal condition like Crohn disease.   Frequent blood draws, such as from blood donation.   Abnormal absorption in the gut.   Heavy menstrual periods in women.   Cancers of the gastrointestinal system, such as colon cancer.  What are the signs or symptoms?  Symptoms of this condition may include:   Fatigue.   Headache.   Pale skin, lips, and nail beds.   Poor appetite.   Weakness.   Shortness of breath.   Dizziness.   Cold hands and feet.   Fast or irregular heartbeat.   Irritability. This is more common in severe anemia.   Rapid breathing. This is more common in severe anemia.  Mild anemia may not cause any symptoms.  How is this diagnosed?  This condition is diagnosed based on:   Your medical history.   A physical exam.   Blood tests.  You may have additional tests to find the underlying cause of your anemia, such as:   Testing for blood in the stool (fecal occult blood test).   A procedure to see inside your colon and rectum (colonoscopy).   A procedure to see inside your esophagus and stomach (endoscopy).   A test in which cells are removed from bone marrow (bone marrow aspiration) or fluid is removed from the bone marrow to be examined (biopsy). This is rarely needed.  How is this treated?  This  condition is treated by correcting the cause of your iron deficiency. Treatment may involve:   Adding iron-rich foods to your diet.   Taking iron supplements. If you are pregnant or breastfeeding, you may need to take extra iron because your normal diet usually does not provide the amount of iron that you need.   Increasing vitamin C intake. Vitamin C helps your body absorb iron. Your health care provider may recommend that you take iron supplements along with a glass of orange juice or a vitamin C supplement.   Medicines to make heavy menstrual flow lighter.   Surgery.  You may need repeat blood tests to determine whether treatment is working. Depending on the underlying cause, the anemia should be corrected within 2 months of starting treatment. If the treatment does not seem to be working, you may need more testing.  Follow these instructions at home:  Medicines   Take over-the-counter and prescription medicines only as told by your health care provider. This includes iron supplements and vitamins.   If you cannot tolerate taking iron supplements by mouth, talk with your health care provider about taking them through a vein (intravenously) or an injection into a muscle.   For the best iron absorption, you should take iron supplements when your stomach is empty. If you cannot tolerate them on an empty stomach,   you may need to take them with food.   Do not drink milk or take antacids at the same time as your iron supplements. Milk and antacids may interfere with iron absorption.   Iron supplements can cause constipation. To prevent constipation, include fiber in your diet as told by your health care provider. A stool softener may also be recommended.  Eating and drinking     Talk with your health care provider before changing your diet. He or she may recommend that you eat foods that contain a lot of iron, such as:  ? Liver.  ? Low-fat (lean) beef.  ? Breads and cereals that have iron added to them (are  fortified).  ? Eggs.  ? Dried fruit.  ? Dark green, leafy vegetables.   To help your body use the iron from iron-rich foods, eat those foods at the same time as fresh fruits and vegetables that are high in vitamin C. Foods that are high in vitamin C include:  ? Oranges.  ? Peppers.  ? Tomatoes.  ? Mangoes.   Drinkenoughfluid to keep your urine clear or pale yellow.  General instructions   Return to your normal activities as told by your health care provider. Ask your health care provider what activities are safe for you.   Practice good hygiene. Anemia can make you more prone to illness and infection.   Keep all follow-up visits as told by your health care provider. This is important.  Contact a health care provider if:   You feel nauseous or you vomit.   You feel weak.   You have unexplained sweating.   You develop symptoms of constipation, such as:  ? Having fewer than three bowel movements a week.  ? Straining to have a bowel movement.  ? Having stools that are hard, dry, or larger than normal.  ? Feeling full or bloated.  ? Pain in the lower abdomen.  ? Not feeling relief after having a bowel movement.  Get help right away if:   You faint. If this happens, do not drive yourself to the hospital. Call your local emergency services (911 in the [place]   You have chest pain.   You have shortness of breath that:  ? Is severe.  ? Gets worse with physical activity.   You have a rapid heartbeat.   You become light-headed when getting up from a sitting or lying down position.  This information is not intended to replace advice given to you by your health care provider. Make sure you discuss any questions you have with your health care provider.  Document Released: 09/08/2000 Document Revised: 05/31/2016 Document Reviewed: 05/31/2016  Elsevier Interactive Patient Education  2019 Elsevier Inc.

## 2018-10-01 LAB — IRON, TOTAL/TOTAL IRON BINDING CAP
%SAT: 10 % (calc) — ABNORMAL LOW (ref 16–45)
Iron: 35 ug/dL — ABNORMAL LOW (ref 40–190)
TIBC: 335 ug/dL (ref 250–450)

## 2018-10-03 LAB — POC HEMOCCULT BLD/STL (HOME/3-CARD/SCREEN)
Card #2 Fecal Occult Blod, POC: NEGATIVE
Card #3 Fecal Occult Blood, POC: NEGATIVE
Fecal Occult Blood, POC: NEGATIVE

## 2018-10-03 NOTE — Addendum Note (Signed)
Addended by: Bonnye Fava on: 10/03/2018 09:04 AM   Modules accepted: Orders

## 2018-10-04 ENCOUNTER — Encounter: Payer: Self-pay | Admitting: Family Medicine

## 2018-10-06 NOTE — Telephone Encounter (Signed)
I called patient and discussed results.  She will take daily iron and get CBC checked back in [place] in 1-2 months.  She will also be consulting her GI there to consider colonoscopy (hemoccults were negative).

## 2018-11-22 NOTE — Telephone Encounter (Signed)
ERROR

## 2019-04-04 ENCOUNTER — Ambulatory Visit: Payer: BLUE CROSS/BLUE SHIELD | Admitting: Family Medicine

## 2019-04-04 DIAGNOSIS — Z0289 Encounter for other administrative examinations: Secondary | ICD-10-CM

## 2019-09-11 ENCOUNTER — Encounter: Payer: Self-pay | Admitting: Family Medicine

## 2020-02-06 IMAGING — MG DIGITAL DIAGNOSTIC BILATERAL MAMMOGRAM WITH TOMO AND CAD
6 of 10 series · 6 of 26 positions shown · non-contrast
Comparison: Previous exam(s).

CLINICAL DATA: Patient had a diagnostic mammogram in Saturday March, 2017
where probable benign calcifications are seen in the right breast.

EXAM:
DIGITAL DIAGNOSTIC BILATERAL MAMMOGRAM WITH CAD AND TOMO

[R ML]
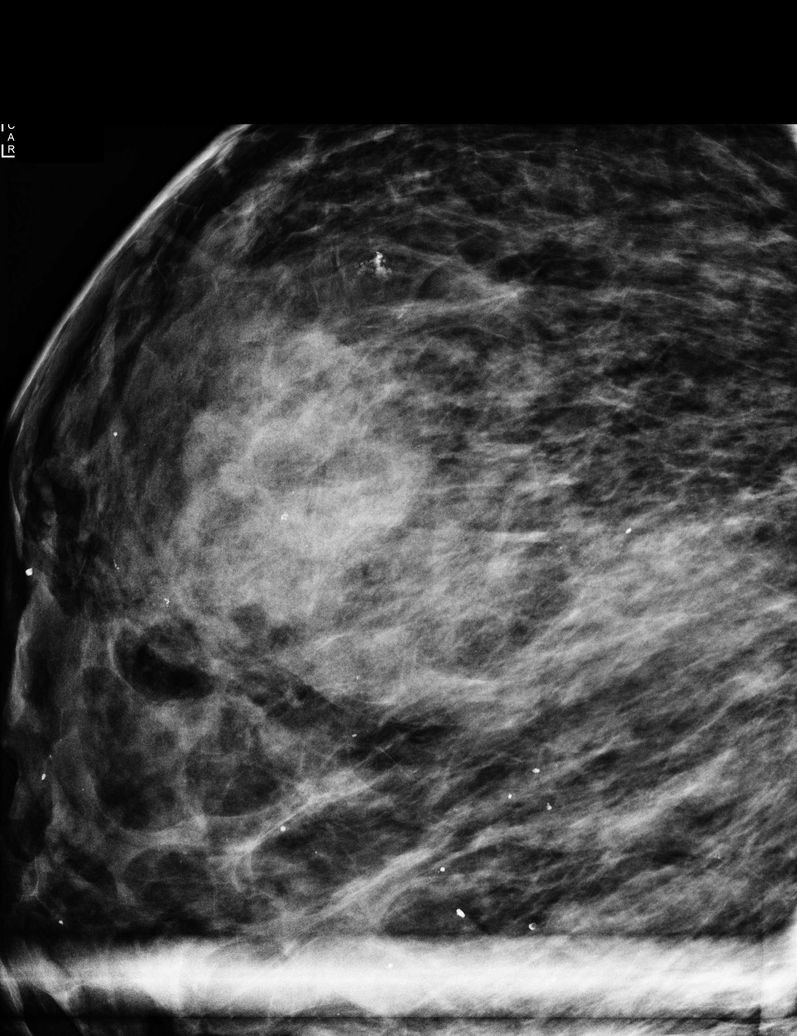

[R CC]
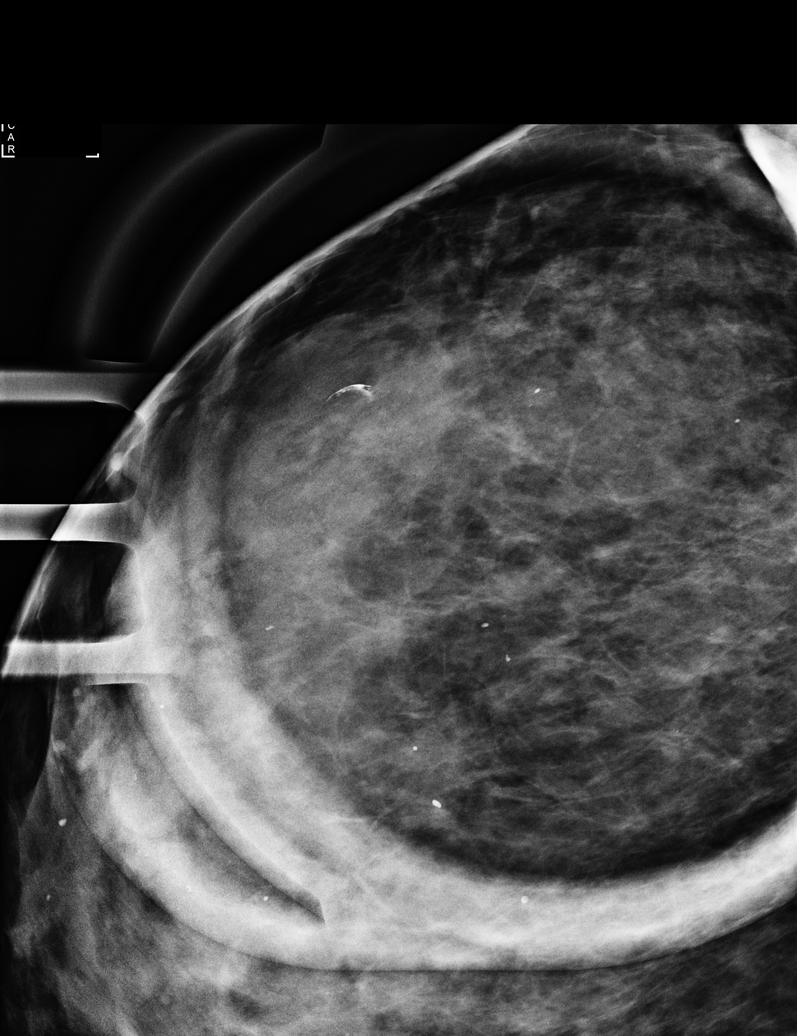

[R MLO synth-2D]
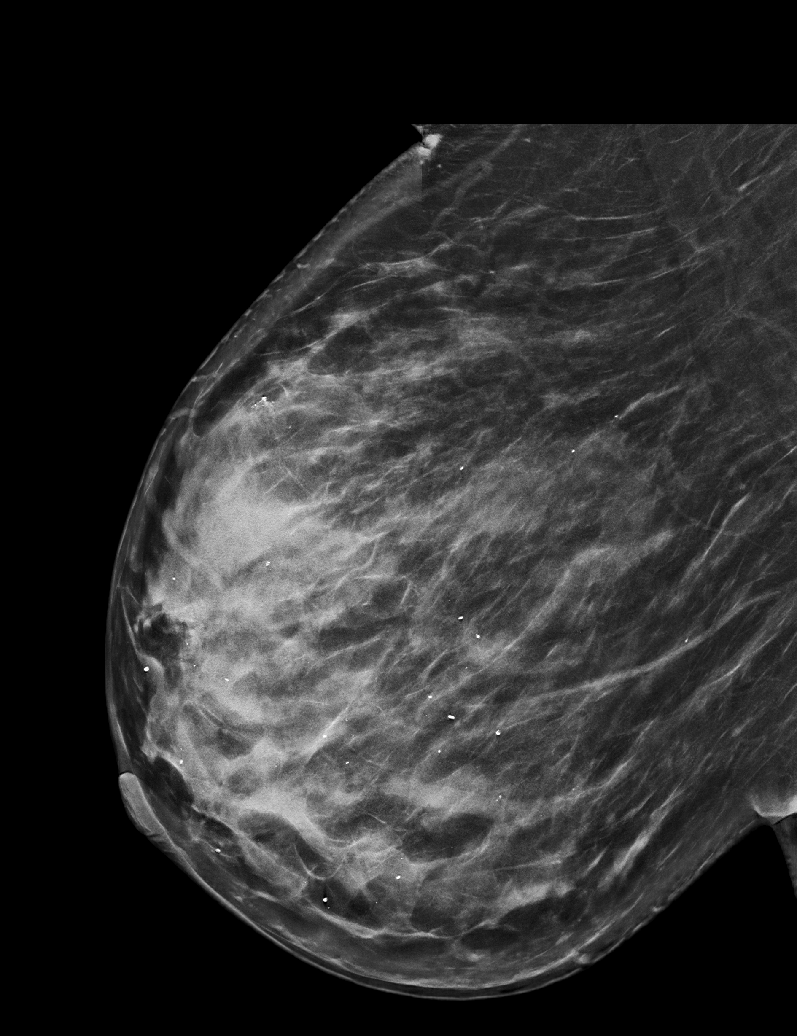

[L MLO synth-2D]
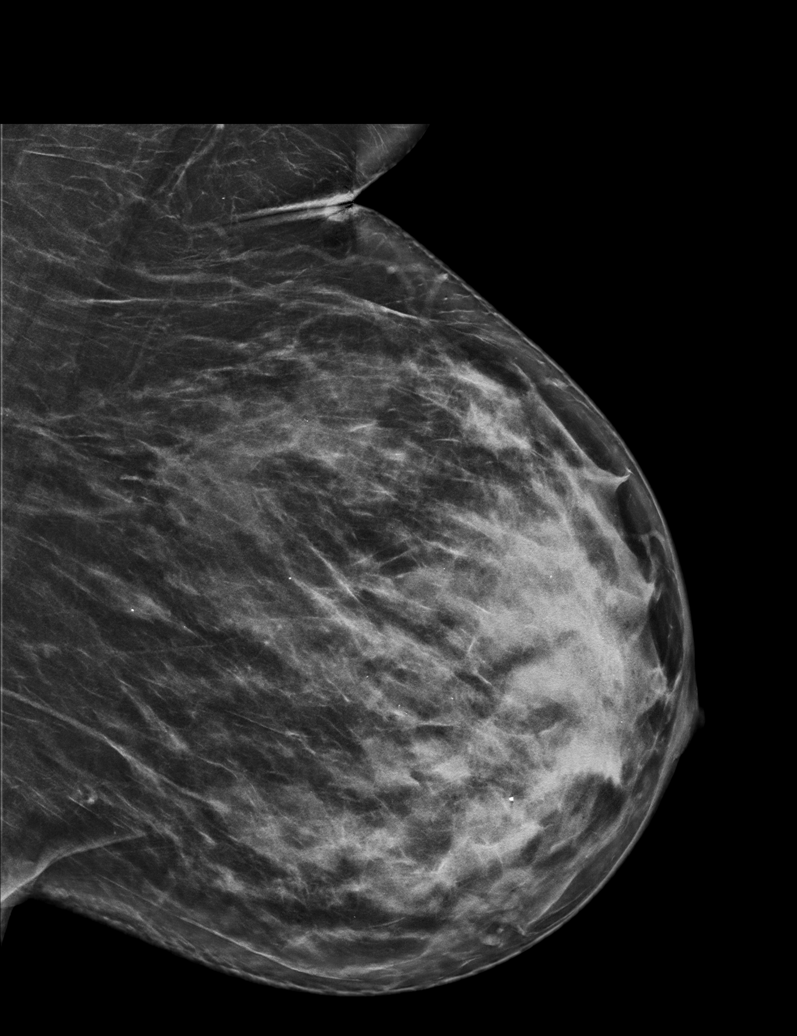

[L CC synth-2D]
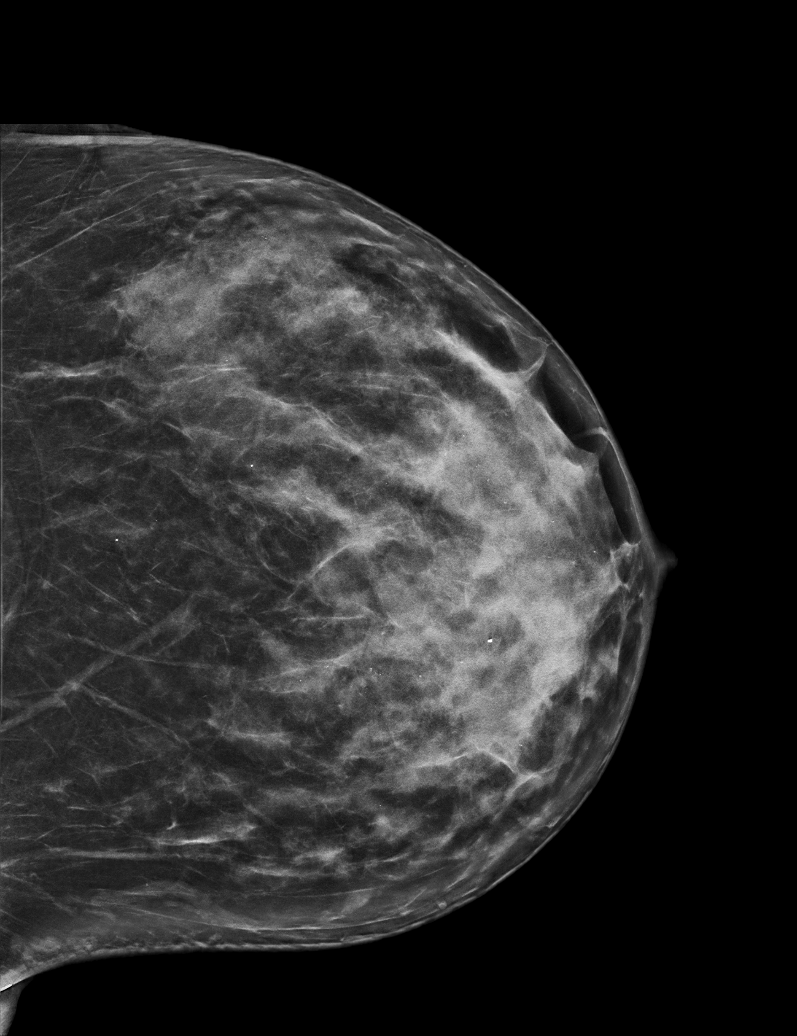

[R CC synth-2D]
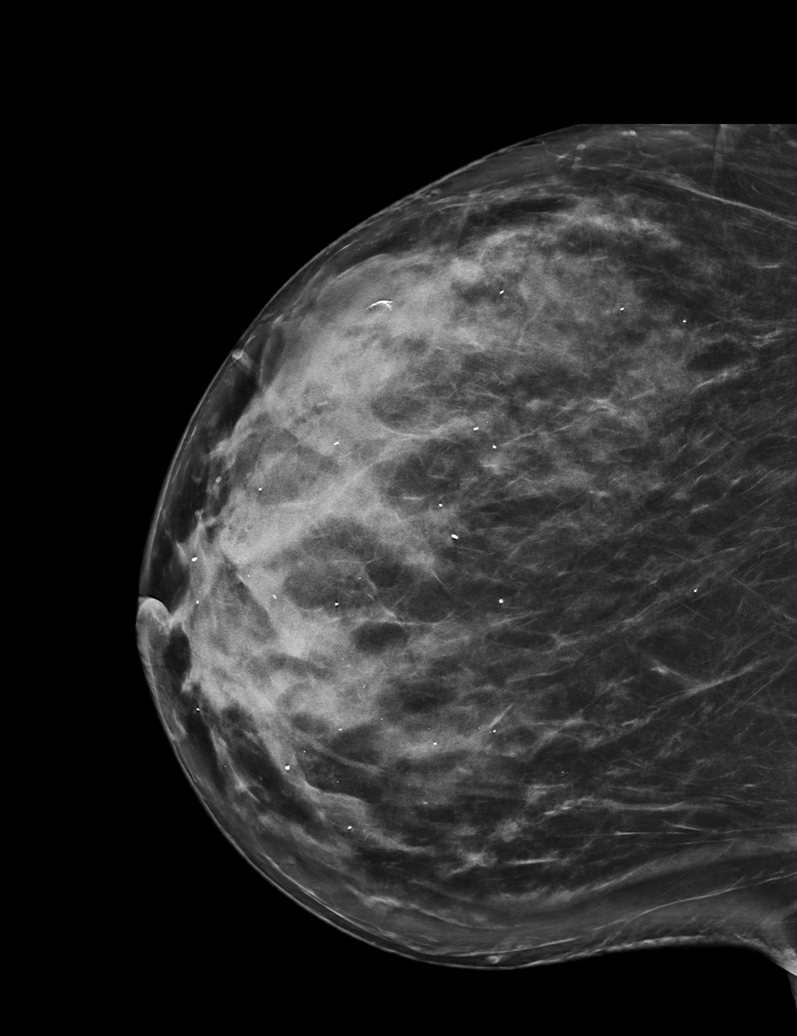

[6 of 26 positions shown; findings below may reference images not displayed]

ACR Breast Density Category c: The breast tissue is heterogeneously
dense, which may obscure small masses.
FINDINGS: Dystrophic calcifications are stable in the upper-outer quadrant of
the right breast. No suspicious calcifications or mass identified in
either breast.

Mammographic images were processed with CAD.
IMPRESSION: No evidence of malignancy in either breast.

RECOMMENDATION:
Bilateral screening mammogram in 1 year is recommended.

I have discussed the findings and recommendations with the patient.
Results were also provided in writing at the conclusion of the
visit. If applicable, a reminder letter will be sent to the patient
regarding the next appointment.

BI-RADS CATEGORY  2: Benign.

## 2020-09-20 DIAGNOSIS — H40013 Open angle with borderline findings, low risk, bilateral: Secondary | ICD-10-CM | POA: Diagnosis not present

## 2020-09-20 DIAGNOSIS — E119 Type 2 diabetes mellitus without complications: Secondary | ICD-10-CM | POA: Diagnosis not present

## 2020-09-20 DIAGNOSIS — H2513 Age-related nuclear cataract, bilateral: Secondary | ICD-10-CM | POA: Diagnosis not present

## 2020-09-20 DIAGNOSIS — H524 Presbyopia: Secondary | ICD-10-CM | POA: Diagnosis not present

## 2020-09-20 DIAGNOSIS — H353132 Nonexudative age-related macular degeneration, bilateral, intermediate dry stage: Secondary | ICD-10-CM | POA: Diagnosis not present

## 2020-09-20 LAB — HM DIABETES EYE EXAM

## 2020-11-24 ENCOUNTER — Encounter: Payer: Self-pay | Admitting: General Practice

## 2021-09-22 DIAGNOSIS — H524 Presbyopia: Secondary | ICD-10-CM | POA: Diagnosis not present

## 2021-09-22 DIAGNOSIS — H25813 Combined forms of age-related cataract, bilateral: Secondary | ICD-10-CM | POA: Diagnosis not present

## 2021-09-22 DIAGNOSIS — E119 Type 2 diabetes mellitus without complications: Secondary | ICD-10-CM | POA: Diagnosis not present

## 2021-09-22 DIAGNOSIS — H40013 Open angle with borderline findings, low risk, bilateral: Secondary | ICD-10-CM | POA: Diagnosis not present

## 2021-09-22 DIAGNOSIS — H353132 Nonexudative age-related macular degeneration, bilateral, intermediate dry stage: Secondary | ICD-10-CM | POA: Diagnosis not present

## 2021-09-22 LAB — HM DIABETES EYE EXAM

## 2022-04-10 ENCOUNTER — Telehealth: Payer: Self-pay | Admitting: Family Medicine

## 2022-04-10 NOTE — Telephone Encounter (Signed)
Pt husband is calling and would like his wife to re-est with dr Caryl Never. Last seen 09-2018

## 2022-04-13 NOTE — Telephone Encounter (Signed)
Lmom for pt husband that it is ok for his wife to re-est

## 2022-04-26 NOTE — Telephone Encounter (Signed)
Lmom for pt husband that it is ok for his wife to re-est

## 2022-05-16 ENCOUNTER — Encounter: Payer: Self-pay | Admitting: Cardiovascular Disease

## 2022-05-16 ENCOUNTER — Ambulatory Visit: Payer: BC Managed Care – PPO | Admitting: Cardiovascular Disease

## 2022-05-16 VITALS — BP 107/70 | HR 60 | Ht 64.0 in | Wt 170.6 lb

## 2022-05-16 DIAGNOSIS — T466X5A Adverse effect of antihyperlipidemic and antiarteriosclerotic drugs, initial encounter: Secondary | ICD-10-CM

## 2022-05-16 DIAGNOSIS — G720 Drug-induced myopathy: Secondary | ICD-10-CM | POA: Diagnosis not present

## 2022-05-16 DIAGNOSIS — I10 Essential (primary) hypertension: Secondary | ICD-10-CM

## 2022-05-16 DIAGNOSIS — I2510 Atherosclerotic heart disease of native coronary artery without angina pectoris: Secondary | ICD-10-CM

## 2022-05-16 DIAGNOSIS — E785 Hyperlipidemia, unspecified: Secondary | ICD-10-CM

## 2022-05-16 DIAGNOSIS — E7800 Pure hypercholesterolemia, unspecified: Secondary | ICD-10-CM

## 2022-05-16 MED ORDER — VALSARTAN-HYDROCHLOROTHIAZIDE 80-12.5 MG PO TABS: 1.0000 | ORAL_TABLET | Freq: Every day | ORAL | 3 refills | Status: AC

## 2022-05-16 NOTE — Patient Instructions (Addendum)
Medication Instructions:  Dr. Royann Shivers recommends Repatha (PCSK9). This is an injectable cholesterol medication. This medication will need prior approval with your insurance company, which we will work on. If the medication is not approved initially, we may need to do an appeal with your insurance. We will keep you updated on this process. This medication can be provided at some local pharmacies or be shipped to you from a specialty pharmacy.   *If you need a refill on your cardiac medications before your next appointment, please call your pharmacy*   Lab Work: None ordered If you have labs (blood work) drawn today and your tests are completely normal, you will receive your results only by: MyChart Message (if you have MyChart) OR A paper copy in the mail If you have any lab test that is abnormal or we need to change your treatment, we will call you to review the results.   Testing/Procedures: None ordered   Follow-Up: At [facility], you and your health needs are our priority.  As part of our continuing mission to provide you with exceptional heart care, we have created designated Provider Care Teams.  These Care Teams include your primary Cardiologist (physician) and Advanced Practice Providers (APPs -  Physician Assistants and Nurse Practitioners) who all work together to provide you with the care you need, when you need it.  We recommend signing up for the patient portal called "MyChart".  Sign up information is provided on this After Visit Summary.  MyChart is used to connect with patients for Virtual Visits (Telemedicine).  Patients are able to view lab/test results, encounter notes, upcoming appointments, etc.  Non-urgent messages can be sent to your provider as well.   To learn more about what you can do with MyChart, go to [email or web address].    Your next appointment:   12 month(s)  The format for your next appointment:   In Person  Provider:   Dr.  Royann Shivers  Appointment with pharmD to discuss Repatha

## 2022-05-17 ENCOUNTER — Encounter: Payer: Self-pay | Admitting: Pharmacist

## 2022-05-17 ENCOUNTER — Telehealth: Payer: Self-pay | Admitting: Pharmacist

## 2022-05-17 ENCOUNTER — Ambulatory Visit: Payer: BC Managed Care – PPO | Admitting: Pharmacist

## 2022-05-17 ENCOUNTER — Encounter: Payer: Self-pay | Admitting: Family Medicine

## 2022-05-17 ENCOUNTER — Ambulatory Visit (INDEPENDENT_AMBULATORY_CARE_PROVIDER_SITE_OTHER): Payer: BC Managed Care – PPO | Admitting: Family Medicine

## 2022-05-17 VITALS — BP 126/80 | HR 70 | Temp 98.5°F | Ht 63.39 in | Wt 170.4 lb

## 2022-05-17 DIAGNOSIS — I2510 Atherosclerotic heart disease of native coronary artery without angina pectoris: Secondary | ICD-10-CM | POA: Diagnosis not present

## 2022-05-17 DIAGNOSIS — I10 Essential (primary) hypertension: Secondary | ICD-10-CM | POA: Diagnosis not present

## 2022-05-17 DIAGNOSIS — E119 Type 2 diabetes mellitus without complications: Secondary | ICD-10-CM

## 2022-05-17 DIAGNOSIS — E782 Mixed hyperlipidemia: Secondary | ICD-10-CM

## 2022-05-17 DIAGNOSIS — E781 Pure hyperglyceridemia: Secondary | ICD-10-CM

## 2022-05-17 LAB — HEPATIC FUNCTION PANEL
ALT: 7 U/L (ref 0–35)
AST: 11 U/L (ref 0–37)
Albumin: 4.2 g/dL (ref 3.5–5.2)
Alkaline Phosphatase: 60 U/L (ref 39–117)
Bilirubin, Direct: 0.1 mg/dL (ref 0.0–0.3)
Total Bilirubin: 0.5 mg/dL (ref 0.2–1.2)
Total Protein: 6.8 g/dL (ref 6.0–8.3)

## 2022-05-17 LAB — CBC WITH DIFFERENTIAL/PLATELET
Basophils Absolute: 0 10*3/uL (ref 0.0–0.1)
Basophils Relative: 0.7 % (ref 0.0–3.0)
Eosinophils Absolute: 0.1 10*3/uL (ref 0.0–0.7)
Eosinophils Relative: 1.9 % (ref 0.0–5.0)
HCT: 37.5 % (ref 36.0–46.0)
Hemoglobin: 11 g/dL — ABNORMAL LOW (ref 12.0–15.0)
Lymphocytes Relative: 32.7 % (ref 12.0–46.0)
Lymphs Abs: 2.1 10*3/uL (ref 0.7–4.0)
MCHC: 29.4 g/dL — ABNORMAL LOW (ref 30.0–36.0)
MCV: 67.8 fl — ABNORMAL LOW (ref 78.0–100.0)
Monocytes Absolute: 0.3 10*3/uL (ref 0.1–1.0)
Monocytes Relative: 4.2 % (ref 3.0–12.0)
Neutro Abs: 3.9 10*3/uL (ref 1.4–7.7)
Neutrophils Relative %: 60.5 % (ref 43.0–77.0)
Platelets: 285 10*3/uL (ref 150.0–400.0)
RBC: 5.53 Mil/uL — ABNORMAL HIGH (ref 3.87–5.11)
RDW: 14.1 % (ref 11.5–15.5)
WBC: 6.5 10*3/uL (ref 4.0–10.5)

## 2022-05-17 LAB — LIPID PANEL
Cholesterol: 183 mg/dL (ref 0–200)
HDL: 54.3 mg/dL (ref >39.00)
LDL Cholesterol: 111 mg/dL — ABNORMAL HIGH (ref 0–99)
NonHDL: 128.46
Total CHOL/HDL Ratio: 3
Triglycerides: 89 mg/dL (ref 0.0–149.0)
VLDL: 17.8 mg/dL (ref 0.0–40.0)

## 2022-05-17 LAB — BASIC METABOLIC PANEL
BUN: 18 mg/dL (ref 6–23)
CO2: 28 mEq/L (ref 19–32)
Calcium: 9.1 mg/dL (ref 8.4–10.5)
Chloride: 104 mEq/L (ref 96–112)
Creatinine, Ser: 0.56 mg/dL (ref 0.40–1.20)
GFR: 104.72 mL/min (ref >60.00)
Glucose, Bld: 92 mg/dL (ref 70–99)
Potassium: 4.2 mEq/L (ref 3.5–5.1)
Sodium: 139 mEq/L (ref 135–145)

## 2022-05-17 LAB — HEMOGLOBIN A1C: Hgb A1c MFr Bld: 6.6 % — ABNORMAL HIGH (ref 4.6–6.5)

## 2022-05-17 NOTE — Telephone Encounter (Signed)
PA submitted for Repatha.  Key: [id number]

## 2022-05-17 NOTE — Progress Notes (Signed)
Established Patient Office Visit  Subjective   Patient ID: Savannah Wise, female    DOB: [birth year]  Age: 51 y.o. MRN: [id number]  Chief Complaint  Patient presents with   New Patient (Initial Visit)    HPI   Savannah Wise is seen to reestablish care.  Last seen here 2019.  She has history of hypertension, type 2 diabetes, vitamin D deficiency, thalassemia minor, hyperlipidemia.  She had gastric bypass surgery back in 2019 and has had substantial weight loss since then.  She states she has gained about 10 pounds actually in the past few months.  She also relates new history that she has coronary disease.  Back in March 2021 she had reportedly abnormal stress test.  This led eventually to CT morphology study and catheterization.  She states she has 3 vessels stented at that time.  We have no record but it sounds like she had right coronary artery and possibly left anterior descending and she states left main  She has been intolerant of statins in the past.  She has follow-up with cardiology later today.  There have been discussion of possible PCSK9 inhibitor.  Current medications include aspirin 81 mg daily, Plavix 75 mg daily, valsartan HCTZ 80/12.5 mg 1 daily  She has seen gynecologist in the past but her previous GYN retired.  She has not apparently ever had colonoscopy and mammogram was over 2 years ago.  No history of reported shingles vaccine.  Social history-she and her husband live in [place] and are here visiting for couple of weeks.  They have a son who graduated from [employer] and is currently working in [place].  They have a daughter who is studying medicine in [place].  Patient has no history of smoking.  No alcohol use.  Past Medical History:  Diagnosis Date   Anemia    mild   Diabetes mellitus without complication (HCC)    type 2 dx 2013   Dizzy spells    Exogenous obesity    mild   HA (headache)    every morning   Heart disease    Hypertension     Hypertriglyceridemia    Shortness of breath dyspnea    with walking-EKG and check up done   Past Surgical History:  Procedure Laterality Date   Bariactric Surgery  06/27/2016   BREAST BIOPSY Left 1991   BREAST SURGERY Left    breast biopsy 2010   HERNIA REPAIR  2006   abdominal   INSERTION OF MESH N/A 04/15/2015   Procedure: INSERTION OF MESH;  Surgeon: Avel Peace, MD;  Location: [facility];  Service: General;  Laterality: N/A;   LAPAROSCOPIC LYSIS OF ADHESIONS N/A 04/15/2015   Procedure: LAPAROSCOPIC LYSIS OF ADHESIONS;  Surgeon: Avel Peace, MD;  Location: [facility];  Service: General;  Laterality: N/A;   VENTRAL HERNIA REPAIR N/A 04/15/2015   Procedure: LAPAROSCOPIC RECURRENT VENTRAL HERNIA REPAIR WITH MESH;  Surgeon: Avel Peace, MD;  Location: [facility];  Service: General;  Laterality: N/A;    reports that she has never smoked. She has never used smokeless tobacco. She reports that she does not drink alcohol and does not use drugs. family history includes Arthritis in her father; Atrial fibrillation in her mother; COPD in her mother; Diabetes in her father and mother; Heart disease in her father and mother; Hyperlipidemia in her father and mother; Hypertension in her father and mother. Allergies  Allergen Reactions   Iodinated Contrast Media Hives and  Rash    S/P 10 MIN AFTER RECEIVING CONTRAST DEVELOPED HIVES UPPER CHEST /NECK AND LEGS , PATIENT RECEIVED 50 MG BENADRYL/MMS   Omnipaque [Iohexol] Hives, Itching and Swelling    Review of Systems  Constitutional:  Negative for chills, fever and malaise/fatigue.  Eyes:  Negative for blurred vision.  Respiratory:  Negative for shortness of breath.   Cardiovascular:  Negative for chest pain.  Neurological:  Negative for dizziness, weakness and headaches.   had    Objective:     BP 126/80 (BP Location: Left Arm, Patient Position: Sitting, Cuff Size: Normal)   Pulse 70   Temp 98.5 F (36.9 C) (Oral)   Ht 5' 3.39" (1.61 m)    Wt 170 lb 6.4 oz (77.3 kg)   SpO2 98%   BMI 29.82 kg/m    Physical Exam Vitals reviewed.  Constitutional:      Appearance: She is well-developed.  Eyes:     Pupils: Pupils are equal, round, and reactive to light.  Neck:     Thyroid: No thyromegaly.     Vascular: No JVD.  Cardiovascular:     Rate and Rhythm: Normal rate and regular rhythm.     Heart sounds:     No gallop.  Pulmonary:     Effort: Pulmonary effort is normal. No respiratory distress.     Breath sounds: Normal breath sounds. No wheezing or rales.  Musculoskeletal:     Cervical back: Neck supple.     Right lower leg: No edema.     Left lower leg: No edema.  Neurological:     Mental Status: She is alert.      No results found for any visits on 05/17/22.    The ASCVD Risk score (Arnett DK, et al., 2019) failed to calculate for the following reasons:   Cannot find a previous HDL lab   Cannot find a previous total cholesterol lab    Assessment & Plan:   Problem List Items Addressed This Visit       Unprioritized   CAD (coronary artery disease)   Relevant Orders   Lipid panel   CBC with Differential/Platelet   Mixed hyperlipidemia   Relevant Orders   Hepatic function panel   Essential hypertension - Primary   Relevant Orders   Basic metabolic panel   CBC with Differential/Platelet   Diabetes mellitus without complication (HCC)   Relevant Orders   Hemoglobin A1c   CBC with Differential/Platelet  Patient not seen in several years.  Diagnosed 2 years ago with CAD.  We do not have exact details but she states she had 3 stents placed back in March 2021.  No recent chest pains.  She walks regularly for exercise. She recalls having follow-up stress test last year which apparently went well with no concerning findings  -Needs follow-up labs and these will be obtained today. -She has had intolerance with statins and discussing PCSK9 inhibitors with cardiology  -We did strongly suggest she set up to get  colonoscopy which she will consider getting in [place] and also consider mammogram which she will likely set up through [facility] imaging where she is gotten these previously.  -We discussed Shingrix vaccine and she wishes to wait until she gets back home to [place] to initiate that  No follow-ups on file.    Evelena Peat, MD

## 2022-05-17 NOTE — Progress Notes (Signed)
Patient ID: Savannah Wise                 DOB: Jul 31, 1971                    MRN: [id number]     HPI: Savannah Wise is a 51 y.o. female patient referred to lipid clinic by Dr Royann Shivers. PMH is significant for CAD, HTN, HLD, and T2DM.  Patient presents today with daughter. Is planning on traveling back home to [place] in a few weeks and will likely be there until next June. Daughter is a second year Psychologist, occupational in [place].    Has tried ezetimibe, atorvastatin, and rosuvastatin in the past. The statins all caused severe myalgias. Does not remember adverse effect to ezetimibe   Current Medications:  N/A  Intolerances:  Atorvastatin (muscle pain) Rosuvastatin (muscle pain)  Risk Factors:  CAD HLD T2DM  LDL goal: <55  Labs: Drawn this morning, results not back yet  Past Medical History:  Diagnosis Date   Anemia    mild   Diabetes mellitus without complication (HCC)    type 2 dx 2013   Dizzy spells    Exogenous obesity    mild   HA (headache)    every morning   Heart disease    Hypertension    Hypertriglyceridemia    Shortness of breath dyspnea    with walking-EKG and check up done    Current Outpatient Medications on File Prior to Visit  Medication Sig Dispense Refill   aspirin EC 81 MG tablet Take 81 mg by mouth daily.     Cholecalciferol (VITAMIN D) 2000 units tablet Take 1 tablet (2,000 Units total) by mouth daily. 90 tablet 3   clopidogrel (PLAVIX) 75 MG tablet Take 75 mg by mouth daily.     Naproxen Sodium (ALEVE PO) Take 2 tablets by mouth as needed (pain).     PRESCRIPTION MEDICATION      valsartan-hydrochlorothiazide (DIOVAN-HCT) 80-12.5 MG tablet Take 1 tablet by mouth daily. 90 tablet 3   Vitamin D, Ergocalciferol, (DRISDOL) 50000 units CAPS capsule TAKE ONE CAPSULE BY MOUTH EVERY 7 DAYS. 12 capsule 1   No current facility-administered medications on file prior to visit.    Allergies  Allergen Reactions   Iodinated Contrast Media Hives and Rash     S/P 10 MIN AFTER RECEIVING CONTRAST DEVELOPED HIVES UPPER CHEST /NECK AND LEGS , PATIENT RECEIVED 50 MG BENADRYL/MMS   Omnipaque [Iohexol] Hives, Itching and Swelling    Assessment/Plan:  1. Hyperlipidemia - Patient goal LDL <55 due to DM and CAD, unknown what current levels are. Not on any lipid lowering therapy.  Since patient intolerant to statins and ezetimibe, Dr Royann Shivers recommended [id number].  Using [employer], educated patient on sotrage, site selection, administration, and possible adverse effects. Patient and daughter were able to demonstrate in room.  Will complete PA when lab results come back.  Had daughter download copay card in room.  Advised that we recommend rechecking lipid panel in about 3 months once she is settled. Voiced understanding.  Start Repatha 140mg  sq q 14 days Check lipid panel in 3 months  , PharmD, BCACP, CDCES, CPP [address] [place], [place], [place] Phone: [phone], Fax: [phone]

## 2022-05-17 NOTE — Patient Instructions (Addendum)
It was nice meeting you today  We would like your LDL (bad cholesterol) to be less than 70  We would like to start you on a new medication called Repatha which you will inject once every 2 weeks  Once your lab work comes back I will complete the prior authorization for you and contact you when it is approved  We would like to check your cholesterol levels in about 3 months  Please call or message with any questions!  Laural Golden, PharmD, BCACP, CDCES, CPP [address], [address] [place], [place], [address] Phone: [phone], Fax: [phone]

## 2022-05-18 NOTE — Progress Notes (Signed)
Cardiology Office Note:    Date:  05/18/2022   ID:  Savannah Wise, DOB 1971/03/01, MRN [id number]  PCP:  Kristian Covey, MD   [facility] Providers Cardiologist:  Thurmon Fair, MD     Referring MD: Kristian Covey, MD   Chief Complaint  Patient presents with   Consult  [place] Rickayla Wieland is a 51 y.o. female who is being seen today for the evaluation of CAD at the request of Burchette, Elberta Fortis, MD.   History of Present Illness:    Savannah Wise is a 51 y.o. female with a hx of longstanding type 2 diabetes mellitus, essential hypertension, recently diagnosed coronary artery disease, here to establish new cardiology follow-up.  Many years ago she was Dr. Yevonne Pax patient.  She saw Dr. Rennis Golden briefly in 2017, before relocating to [place], where she and her husband now live most of the time.  She is originally from [place], [place].  2 to 3 years ago, in [place] she underwent a stress test.  She tells me that she did well on the treadmill for about 9 minutes but that based on imaging studies she underwent a cardiac catheterization that showed evidence of significant coronary disease.  She did not have angina pectoris at rest or during the stress test.  She tells me that 3 stents were implanted, but it is unclear in which vessels.  Per her report, there was no evidence of myocardial injury.  She was placed on treatment with clopidogrel and statin.  The dose of statin was 5 mg I presume it was rosuvastatin.  She stopped this medication due to muscle cramps and general feeling of weakness which she describes as being "lazy".  She was subsequently able to become more active and even started running on a treadmill for about 1 hour daily.  Recently she was placed on a medication and named ATOZET which is a combination of atorvastatin 10 mg and ezetimibe 10 mg.  Once again, she has developed muscle fatigue and cramps.  Both her parents have a history of  coronary disease, although did develop this in their 73s.  She has well-controlled systemic hypertension.  Her diabetes is now very well controlled per her report.  She is trying to eat a healthy diet.  She is overweight, but not obese.  She has never smoked.  Labs performed today show LDL cholesterol 111, HDL 54, normal triglycerides.  Hemoglobin A1c is 6.6%.  She has mild anemia with a hemoglobin of 11.0, but has an elevated RBC count and marked microcytosis, consistent with thalassemia trait.  Her ECG shows normal sinus rhythm with very minor nonspecific T wave changes.  Past Medical History:  Diagnosis Date   Anemia    mild   Diabetes mellitus without complication (HCC)    type 2 dx 2013   Dizzy spells    Exogenous obesity    mild   HA (headache)    every morning   Heart disease    Hypertension    Hypertriglyceridemia    Shortness of breath dyspnea    with walking-EKG and check up done    Past Surgical History:  Procedure Laterality Date   Bariactric Surgery  06/27/2016   BREAST BIOPSY Left 1991   BREAST SURGERY Left    breast biopsy 2010   HERNIA REPAIR  2006   abdominal   INSERTION OF MESH N/A 04/15/2015   Procedure: INSERTION OF MESH;  Surgeon: Avel Peace,  MD;  Location: [facility];  Service: General;  Laterality: N/A;   LAPAROSCOPIC LYSIS OF ADHESIONS N/A 04/15/2015   Procedure: LAPAROSCOPIC LYSIS OF ADHESIONS;  Surgeon: Avel Peace, MD;  Location: [facility];  Service: General;  Laterality: N/A;   VENTRAL HERNIA REPAIR N/A 04/15/2015   Procedure: LAPAROSCOPIC RECURRENT VENTRAL HERNIA REPAIR WITH MESH;  Surgeon: Avel Peace, MD;  Location: [facility];  Service: General;  Laterality: N/A;    Current Medications: Current Meds  Medication Sig   aspirin EC 81 MG tablet Take 81 mg by mouth daily.   clopidogrel (PLAVIX) 75 MG tablet Take 75 mg by mouth daily.   PRESCRIPTION MEDICATION    Vitamin D, Ergocalciferol, (DRISDOL) 50000 units CAPS capsule TAKE ONE CAPSULE BY  MOUTH EVERY 7 DAYS.   [DISCONTINUED] EZETIMIBE-ATORVASTATIN PO Take 1 tablet by mouth daily.   [DISCONTINUED] valsartan-hydrochlorothiazide (DIOVAN-HCT) 80-12.5 MG tablet Take 1 tablet by mouth daily.     Allergies:   Iodinated contrast media and Omnipaque [iohexol]   Social History   Socioeconomic History   Marital status: Married    Spouse name: Not on file   Number of children: 2   Years of education: Not on file   Highest education level: Not on file  Occupational History   Not on file  Tobacco Use   Smoking status: Never   Smokeless tobacco: Never  Vaping Use   Vaping Use: Never used  Substance and Sexual Activity   Alcohol use: No   Drug use: No   Sexual activity: Yes    Birth control/protection: Post-menopausal  Other Topics Concern   Not on file  Social History Narrative   epworth sleepiness scale = 11 (05/01/16)   Social Determinants of Health   Financial Resource Strain: Not on file  Food Insecurity: Not on file  Transportation Needs: Not on file  Physical Activity: Not on file  Stress: Not on file  Social Connections: Not on file     Family History: The patient's family history includes Arthritis in her father; Atrial fibrillation in her mother; COPD in her mother; Diabetes in her father and mother; Heart disease in her father and mother; Hyperlipidemia in her father and mother; Hypertension in her father and mother.  ROS:   Please see the history of present illness.     All other systems reviewed and are negative.  EKGs/Labs/Other Studies Reviewed:    The following studies were reviewed today:   EKG:  EKG is ordered today.  The ekg ordered today demonstrates normal sinus rhythm, minor nonspecific T wave changes  Recent Labs: 05/17/2022: ALT 7; BUN 18; Creatinine, Ser 0.56; Hemoglobin 11.0; Platelets 285.0; Potassium 4.2; Sodium 139  Recent Lipid Panel    Component Value Date/Time   CHOL 183 05/17/2022 1053   TRIG 89.0 05/17/2022 1053   HDL 54.30  05/17/2022 1053   CHOLHDL 3 05/17/2022 1053   VLDL 17.8 05/17/2022 1053   LDLCALC 111 (H) 05/17/2022 1053   LDLDIRECT 63.9 04/15/2014 1029     Risk Assessment/Calculations:                Physical Exam:    VS:  BP 107/70 (BP Location: Left Arm, Patient Position: Sitting, Cuff Size: Normal)   Pulse 60   Ht 5\' 4"  (1.626 m)   Wt 170 lb 9.6 oz (77.4 kg)   SpO2 98%   BMI 29.28 kg/m     Wt Readings from Last 3 Encounters:  05/17/22 170 lb 6.4 oz (77.3 kg)  05/16/22 170 lb 9.6 oz (77.4 kg)  09/30/18 165 lb (74.8 kg)     GEN: Overweight, well nourished, well developed in no acute distress HEENT: Normal NECK: No JVD; No carotid bruits LYMPHATICS: No lymphadenopathy CARDIAC: RRR, no murmurs, rubs, gallops RESPIRATORY:  Clear to auscultation without rales, wheezing or rhonchi  ABDOMEN: Soft, non-tender, non-distended MUSCULOSKELETAL:  No edema; No deformity  SKIN: Warm and dry NEUROLOGIC:  Alert and oriented x 3 PSYCHIATRIC:  Normal affect   ASSESSMENT:    1. Coronary artery disease involving native coronary artery of native heart without angina pectoris   2. Pure hypercholesterolemia   3. Essential hypertension   4. Statin myopathy    PLAN:    In order of problems listed above:  CAD: Currently asymptomatic.  I asked her to bring me any documentation of the type and location of the stents that she received.  She does believe she has the stent cards at home.  The focus is on risk factor mitigation.  Currently taking aspirin 100 mg daily and clopidogrel 75 mg daily.  These can be continued as they are not causing any problems.  If necessary her clopidogrel can be discontinued since it is well over 12 months since her stent procedures. HLP: Target LDL cholesterol less than 70.  She has evidence of statin myopathy with both atorvastatin and rosuvastatin.  She will need a PCSK9 inhibitor.  Zetia alone is clearly insufficient. HTN: Very well controlled.  Continue same  medications.      They will be returning to [place] on September 8.  Hopefully we can start the treatment with PCSK9 inhibitor before they leave.  We will set up an appointment with our Pharm D.   Medication Adjustments/Labs and Tests Ordered: Current medicines are reviewed at length with the patient today.  Concerns regarding medicines are outlined above.  Orders Placed This Encounter  Procedures   AMB Referral to [facility] Pharm-D   EKG 12-Lead   Meds ordered this encounter  Medications   valsartan-hydrochlorothiazide (DIOVAN-HCT) 80-12.5 MG tablet    Sig: Take 1 tablet by mouth daily.    Dispense:  90 tablet    Refill:  3    Patient Instructions  Medication Instructions:  Dr. Royann Shivers recommends Repatha (PCSK9). This is an injectable cholesterol medication. This medication will need prior approval with your insurance company, which we will work on. If the medication is not approved initially, we may need to do an appeal with your insurance. We will keep you updated on this process. This medication can be provided at some local pharmacies or be shipped to you from a specialty pharmacy.   *If you need a refill on your cardiac medications before your next appointment, please call your pharmacy*   Lab Work: None ordered If you have labs (blood work) drawn today and your tests are completely normal, you will receive your results only by: MyChart Message (if you have MyChart) OR A paper copy in the mail If you have any lab test that is abnormal or we need to change your treatment, we will call you to review the results.   Testing/Procedures: None ordered   Follow-Up: At [facility], you and your health needs are our priority.  As part of our continuing mission to provide you with exceptional heart care, we have created designated Provider Care Teams.  These Care Teams include your primary Cardiologist (physician) and Advanced Practice Providers (APPs -  Physician  Assistants and Nurse Practitioners) who all  work together to provide you with the care you need, when you need it.  We recommend signing up for the patient portal called "MyChart".  Sign up information is provided on this After Visit Summary.  MyChart is used to connect with patients for Virtual Visits (Telemedicine).  Patients are able to view lab/test results, encounter notes, upcoming appointments, etc.  Non-urgent messages can be sent to your provider as well.   To learn more about what you can do with MyChart, go to [email or web address].    Your next appointment:   12 month(s)  The format for your next appointment:   In Person  Provider:   Dr. Royann Shivers  Appointment with pharmD to discuss Repatha    Signed, Thurmon Fair, MD  05/18/2022 5:00 PM    [facility]

## 2022-05-19 ENCOUNTER — Encounter: Payer: Self-pay | Admitting: Cardiovascular Disease

## 2022-05-19 ENCOUNTER — Encounter: Payer: Self-pay | Admitting: Pharmacist

## 2022-05-19 ENCOUNTER — Telehealth: Payer: Self-pay | Admitting: Family Medicine

## 2022-05-19 NOTE — Telephone Encounter (Signed)
Please see result note 

## 2022-05-19 NOTE — Telephone Encounter (Signed)
Pt called, returning CMA's call. CMA was unavailable. Pt asked that CMA call back at their earliest convenience. 

## 2022-06-22 NOTE — Telephone Encounter (Signed)
Lmom that is ok to re-est

## 2023-04-24 DIAGNOSIS — H25813 Combined forms of age-related cataract, bilateral: Secondary | ICD-10-CM | POA: Diagnosis not present

## 2023-04-24 DIAGNOSIS — H524 Presbyopia: Secondary | ICD-10-CM | POA: Diagnosis not present

## 2023-04-24 DIAGNOSIS — H353132 Nonexudative age-related macular degeneration, bilateral, intermediate dry stage: Secondary | ICD-10-CM | POA: Diagnosis not present

## 2023-04-24 DIAGNOSIS — H40013 Open angle with borderline findings, low risk, bilateral: Secondary | ICD-10-CM | POA: Diagnosis not present

## 2023-04-24 DIAGNOSIS — E119 Type 2 diabetes mellitus without complications: Secondary | ICD-10-CM | POA: Diagnosis not present

## 2023-04-24 LAB — HM DIABETES EYE EXAM

## 2023-04-25 ENCOUNTER — Encounter (INDEPENDENT_AMBULATORY_CARE_PROVIDER_SITE_OTHER): Payer: Self-pay

## 2023-05-21 ENCOUNTER — Ambulatory Visit: Payer: BC Managed Care – PPO | Admitting: Family Medicine

## 2023-05-21 ENCOUNTER — Encounter: Payer: Self-pay | Admitting: Family Medicine

## 2023-05-21 VITALS — BP 126/82 | HR 70 | Temp 98.4°F | Ht 63.39 in | Wt 159.5 lb

## 2023-05-21 DIAGNOSIS — Z0000 Encounter for general adult medical examination without abnormal findings: Secondary | ICD-10-CM | POA: Diagnosis not present

## 2023-05-21 DIAGNOSIS — E119 Type 2 diabetes mellitus without complications: Secondary | ICD-10-CM | POA: Diagnosis not present

## 2023-05-21 LAB — CBC WITH DIFFERENTIAL/PLATELET
Basophils Absolute: 0 10*3/uL (ref 0.0–0.1)
Basophils Relative: 0.6 % (ref 0.0–3.0)
Eosinophils Absolute: 0.3 10*3/uL (ref 0.0–0.7)
Eosinophils Relative: 3.2 % (ref 0.0–5.0)
HCT: 37.7 % (ref 36.0–46.0)
Hemoglobin: 11 g/dL — ABNORMAL LOW (ref 12.0–15.0)
Lymphocytes Relative: 31.1 % (ref 12.0–46.0)
Lymphs Abs: 2.6 10*3/uL (ref 0.7–4.0)
MCHC: 28.9 g/dL — ABNORMAL LOW (ref 30.0–36.0)
MCV: 67 fl — ABNORMAL LOW (ref 78.0–100.0)
Monocytes Absolute: 0.5 10*3/uL (ref 0.1–1.0)
Monocytes Relative: 6.1 % (ref 3.0–12.0)
Neutro Abs: 4.9 10*3/uL (ref 1.4–7.7)
Neutrophils Relative %: 59 % (ref 43.0–77.0)
Platelets: 333 10*3/uL (ref 150.0–400.0)
RBC: 5.63 Mil/uL — ABNORMAL HIGH (ref 3.87–5.11)
RDW: 14.2 % (ref 11.5–15.5)
WBC: 8.3 10*3/uL (ref 4.0–10.5)

## 2023-05-21 LAB — LIPID PANEL
Cholesterol: 138 mg/dL (ref 0–200)
HDL: 47.8 mg/dL (ref >39.00)
LDL Cholesterol: 69 mg/dL (ref 0–99)
NonHDL: 90.53
Total CHOL/HDL Ratio: 3
Triglycerides: 107 mg/dL (ref 0.0–149.0)
VLDL: 21.4 mg/dL (ref 0.0–40.0)

## 2023-05-21 LAB — HEMOGLOBIN A1C: Hgb A1c MFr Bld: 6.3 % (ref 4.6–6.5)

## 2023-05-21 LAB — HEPATIC FUNCTION PANEL
ALT: 7 U/L (ref 0–35)
AST: 12 U/L (ref 0–37)
Albumin: 4.2 g/dL (ref 3.5–5.2)
Alkaline Phosphatase: 58 U/L (ref 39–117)
Bilirubin, Direct: 0.1 mg/dL (ref 0.0–0.3)
Total Bilirubin: 0.4 mg/dL (ref 0.2–1.2)
Total Protein: 7 g/dL (ref 6.0–8.3)

## 2023-05-21 LAB — BASIC METABOLIC PANEL
BUN: 19 mg/dL (ref 6–23)
CO2: 26 mEq/L (ref 19–32)
Calcium: 9.4 mg/dL (ref 8.4–10.5)
Chloride: 104 mEq/L (ref 96–112)
Creatinine, Ser: 0.64 mg/dL (ref 0.40–1.20)
GFR: 100.68 mL/min (ref >60.00)
Glucose, Bld: 95 mg/dL (ref 70–99)
Potassium: 4.2 mEq/L (ref 3.5–5.1)
Sodium: 139 mEq/L (ref 135–145)

## 2023-05-21 MED ORDER — REPATHA 140 MG/ML ~~LOC~~ SOSY: 140.0000 mg | PREFILLED_SYRINGE | SUBCUTANEOUS | 1 refills | Status: DC

## 2023-05-21 NOTE — Progress Notes (Signed)
Established Patient Office Visit  Subjective   Patient ID: Savannah Wise, female    DOB: 04-Jan-1971  Age: 52 y.o. MRN: [id number]  Chief Complaint  Patient presents with   Annual Exam    HPI   Savannah Wise is seen for physical exam.  She and her husband have been here for couple months.  They live in [place] but have family here.  She has history of obesity, hypertension, CAD, hyperlipidemia thalassemia minor, vitamin D deficiency.  She had gastric bypass few years ago and continues to gradually lose weight.  Her blood sugars have improved dramatically with her weight loss.  She does complain of some general fatigue issues.  She still walks some for exercise.  No consistent exertional chest pains with walking.  Health maintenance reviewed  -Needs repeat mammogram and Pap smear.  She plans to set these up soon -No history of colon cancer screening -No history of Shingrix -She plans to get flu vaccine when she gets back home. -Tetanus due 2027 -Eye exam up-to-date -Prior hepatitis C screen negative  Social history-she is married.  She has a daughter who is studying medicine in [place].  She has a son who graduated from [employer] few years ago and worked for a [employer] loss [employer] up in [place].  She has no history of smoking.  No alcohol use.  Family history-father is 95.  History of bypass in his late 72s.  Mother died at age 74 of COPD complications.  She also history of CAD and type 2 diabetes.  She has a sister who is generally doing well.  Past Medical History:  Diagnosis Date   Anemia    mild   Diabetes mellitus without complication (HCC)    type 2 dx 2013   Dizzy spells    Exogenous obesity    mild   HA (headache)    every morning   Heart disease    Hypertension    Hypertriglyceridemia    Shortness of breath dyspnea    with walking-EKG and check up done   Past Surgical History:  Procedure Laterality Date   Bariactric Surgery  06/27/2016    BREAST BIOPSY Left 1991   BREAST SURGERY Left    breast biopsy 2010   HERNIA REPAIR  2006   abdominal   INSERTION OF MESH N/A 04/15/2015   Procedure: INSERTION OF MESH;  Surgeon: Avel Peace, MD;  Location: [facility];  Service: General;  Laterality: N/A;   LAPAROSCOPIC LYSIS OF ADHESIONS N/A 04/15/2015   Procedure: LAPAROSCOPIC LYSIS OF ADHESIONS;  Surgeon: Avel Peace, MD;  Location: [facility];  Service: General;  Laterality: N/A;   VENTRAL HERNIA REPAIR N/A 04/15/2015   Procedure: LAPAROSCOPIC RECURRENT VENTRAL HERNIA REPAIR WITH MESH;  Surgeon: Avel Peace, MD;  Location: [facility];  Service: General;  Laterality: N/A;    reports that she has never smoked. She has never used smokeless tobacco. She reports that she does not drink alcohol and does not use drugs. family history includes Arthritis in her father; Atrial fibrillation in her mother; COPD in her mother; Diabetes in her father and mother; Heart disease in her father and mother; Hyperlipidemia in her father and mother; Hypertension in her father and mother. Allergies  Allergen Reactions   Iodinated Contrast Media Hives and Rash    S/P 10 MIN AFTER RECEIVING CONTRAST DEVELOPED HIVES UPPER CHEST /NECK AND LEGS , PATIENT RECEIVED 50 MG BENADRYL/MMS   Omnipaque [Iohexol] Hives, Itching and Swelling  Review of Systems  Constitutional:  Positive for malaise/fatigue and weight loss. Negative for chills and fever.       Her weight loss has been intentional  HENT:  Negative for hearing loss.   Eyes:  Negative for blurred vision and double vision.  Respiratory:  Negative for cough and shortness of breath.   Cardiovascular:  Negative for chest pain, palpitations and leg swelling.  Gastrointestinal:  Negative for abdominal pain, blood in stool, constipation and diarrhea.  Genitourinary:  Negative for dysuria.  Skin:  Negative for rash.  Neurological:  Negative for dizziness, speech change, seizures, loss of consciousness and headaches.   Psychiatric/Behavioral:  Negative for depression.       Objective:     BP 126/82 (BP Location: Right Arm, Patient Position: Sitting, Cuff Size: Normal)   Pulse 70   Temp 98.4 F (36.9 C) (Oral)   Ht 5' 3.39" (1.61 m)   Wt 159 lb 8 oz (72.3 kg)   SpO2 98%   BMI 27.91 kg/m  BP Readings from Last 3 Encounters:  05/21/23 126/82  05/17/22 126/80  05/16/22 107/70   Wt Readings from Last 3 Encounters:  05/21/23 159 lb 8 oz (72.3 kg)  05/17/22 170 lb 6.4 oz (77.3 kg)  05/16/22 170 lb 9.6 oz (77.4 kg)      Physical Exam Vitals reviewed.  Constitutional:      Appearance: Normal appearance.  HENT:     Head: Normocephalic and atraumatic.     Right Ear: Tympanic membrane normal.     Left Ear: Tympanic membrane normal.  Eyes:     Pupils: Pupils are equal, round, and reactive to light.  Cardiovascular:     Rate and Rhythm: Normal rate and regular rhythm.     Heart sounds:     No gallop.  Pulmonary:     Effort: Pulmonary effort is normal.     Breath sounds: Normal breath sounds. No wheezing or rales.  Abdominal:     Palpations: Abdomen is soft.     Tenderness: There is no abdominal tenderness. There is no guarding or rebound.  Musculoskeletal:     Cervical back: Neck supple.     Right lower leg: No edema.     Left lower leg: No edema.  Lymphadenopathy:     Cervical: No cervical adenopathy.  Skin:    Findings: No rash.  Neurological:     General: No focal deficit present.     Mental Status: She is alert.      No results found for any visits on 05/21/23.    The 10-year ASCVD risk score (Arnett DK, et al., 2019) is: 3.8%    Assessment & Plan:   Problem List Items Addressed This Visit       Unprioritized   DM2 (diabetes mellitus, type 2) (HCC) - Primary   Relevant Orders   Hemoglobin A1c   Other Visit Diagnoses     Physical exam       Relevant Orders   Basic metabolic panel   Lipid panel   CBC with Differential/Platelet   Hepatic function panel      52 year old female with history of chronic problems as above.  She has had substantial weight loss since gastric bypass several years ago.  Does have past history of type 2 diabetes.  We discussed several health maintenance items as below  -Strongly recommend colonoscopy.  She has not had any colonoscopy at this point.  She plans to set this up when she  gets back to [place] -Strongly advised setting up repeat mammogram -Discussed Shingrix vaccine which she also will consider when she gets back home -Needs follow-up Pap smear.  She declines today.  She is planning on reestablishing with new GYN.  Her previous GYN retired -Check labs as above.  Include A1c with past history of diabetes. -Continue regular exercise habits -Continue annual flu vaccine  No follow-ups on file.    Evelena Peat, MD

## 2023-05-21 NOTE — Patient Instructions (Signed)
Recommend:  set up colonoscopy and mammogram  Consider Pap smear this year.    Consider Shingrix vaccine this year and flu vaccine this Fall.

## 2023-05-30 ENCOUNTER — Telehealth: Payer: Self-pay | Admitting: Pharmacy Technician

## 2023-05-30 ENCOUNTER — Other Ambulatory Visit (HOSPITAL_COMMUNITY): Payer: Self-pay

## 2023-05-30 NOTE — Telephone Encounter (Signed)
 Clinical questions answered and PA submitted

## 2023-05-30 NOTE — Telephone Encounter (Signed)
Pharmacy Patient Advocate Encounter   Received notification from CoverMyMeds that prior authorization for Repatha 140MG /ML syringes is required/requested.   Insurance verification completed.   The patient is insured through [facility] .   Per test claim: PA required; PA started via CoverMyMeds. KEY [id number] . Waiting for clinical questions to populate.

## 2023-06-01 NOTE — Telephone Encounter (Signed)
Pharmacy Patient Advocate Encounter  Received notification from [facility] that Prior Authorization for Repatha has been DENIED.  Full denial letter will be uploaded to the media tab. See denial reason below.   PA #/Case ID/Reference #: [id number]

## 2023-06-01 NOTE — Telephone Encounter (Signed)
Left detailed message on patient's voicemail informing her of denial and to call back with any questions.

## 2023-06-29 ENCOUNTER — Other Ambulatory Visit: Payer: Self-pay | Admitting: Family Medicine

## 2023-06-29 DIAGNOSIS — Z1211 Encounter for screening for malignant neoplasm of colon: Secondary | ICD-10-CM

## 2023-06-29 DIAGNOSIS — Z1212 Encounter for screening for malignant neoplasm of rectum: Secondary | ICD-10-CM

## 2023-07-12 ENCOUNTER — Other Ambulatory Visit: Payer: Self-pay

## 2023-07-12 MED ORDER — REPATHA 140 MG/ML ~~LOC~~ SOSY: 140.0000 mg | PREFILLED_SYRINGE | SUBCUTANEOUS | 1 refills | Status: AC

## 2024-03-13 ENCOUNTER — Telehealth: Payer: Self-pay | Admitting: Cardiovascular Disease

## 2024-03-13 ENCOUNTER — Ambulatory Visit: Payer: Self-pay

## 2024-03-13 NOTE — Telephone Encounter (Signed)
 FYI Only or Action Required?: FYI only for provider.  Patient was last seen in primary care on 05/21/2023 by Marquetta Sit, MD. Called Nurse Triage reporting Chest Pain. Symptoms began several weeks ago. Interventions attempted: Other: when patient is still there is no pain. Symptoms are: gradually worsening.  Triage Disposition: See Physician Within 24 Hours  Patient/caregiver understands and will follow disposition?: Yes     Reason for Disposition  [1] Chest pain lasts > 5 minutes AND [2] occurred > 3 days ago (72 hours) AND [3] NO chest pain or cardiac symptoms now  Answer Assessment - Initial Assessment Questions Patient states it feels like a sharp feeling and states something inside feels its twisting so I don't feel like I can move, I have to move slowly for the pain to go away  1. LOCATION: Where does it hurt?       Sometimes its right side of chest, sometimes its middle of chest 2. RADIATION: Does the pain go anywhere else? (e.g., into neck, jaw, arms, back)     Sometimes radiates to back 3. ONSET: When did the chest pain begin? (Minutes, hours or days)      2 weeks 4. PATTERN: Does the pain come and go, or has it been constant since it started?  Does it get worse with exertion?      Comes and goes, worsens with movement 5. DURATION: How long does it last (e.g., seconds, minutes, hours)     Lasts duration of moving and making turns and adjustments 6. SEVERITY: How bad is the pain?  (e.g., Scale 1-10; mild, moderate, or severe)    - MILD (1-3): doesn't interfere with normal activities     - MODERATE (4-7): interferes with normal activities or awakens from sleep    - SEVERE (8-10): excruciating pain, unable to do any normal activities       5 7. CARDIAC RISK FACTORS: Do you have any history of heart problems or risk factors for heart disease? (e.g., angina, prior heart attack; diabetes, high blood pressure, high cholesterol, smoker, or strong family  history of heart disease)     2 stents in heart 5 years ago, palpitations 8. PULMONARY RISK FACTORS: Do you have any history of lung disease?  (e.g., blood clots in lung, asthma, emphysema, birth control pills)     No 9. CAUSE: What do you think is causing the chest pain?     Patient is uncertain 10. OTHER SYMPTOMS: Do you have any other symptoms? (e.g., dizziness, nausea, vomiting, sweating, fever, difficulty breathing, cough)       Sometimes difficulty breathing,   If patient is sitting still she doesn't feel any pain.  Denies any chest pain currently, nausea, sweating  Protocols used: Chest Pain-A-AH

## 2024-03-13 NOTE — Telephone Encounter (Signed)
 Spoke with pt's husband, DPR who reports pt has been having intermittent palpitations with occasional DOE.  Denies dizziness.  See current BP below.  No HR available at this time.  Pt's husband states pt has also been having some chest pain on the right side worse with movement like driving or turning over in the bed.  Pt feels this is muscular.  Pt has been scheduled to see her PCP for this problem on 03/14/24.  Pt's husband would like to scheduled an appointment with cardiology for further evaluation of pt's palpitations.  Appointment scheduled with K. West,NP on 03/25/2024.  Pt's husband verbalizes understanding and agrees with current plan.

## 2024-03-13 NOTE — Telephone Encounter (Signed)
 Patient c/o Palpitations:  STAT if patient reporting lightheadedness, shortness of breath, or chest pain  How long have you had palpitations/irregular HR/ Afib? Are you having the symptoms now? Palps on/off  Are you currently experiencing lightheadedness, SOB or CP? No  Do you have a history of afib (atrial fibrillation) or irregular heart rhythm? No  Have you checked your BP or HR? (document readings if available): 100/58 last night  Are you experiencing any other symptoms? No (spouse calling for pt and not with pt currently)

## 2024-03-14 ENCOUNTER — Encounter: Payer: Self-pay | Admitting: Family Medicine

## 2024-03-14 ENCOUNTER — Ambulatory Visit (INDEPENDENT_AMBULATORY_CARE_PROVIDER_SITE_OTHER): Admitting: Family Medicine

## 2024-03-14 VITALS — BP 110/74 | HR 68 | Temp 98.6°F | Wt 158.9 lb

## 2024-03-14 DIAGNOSIS — R079 Chest pain, unspecified: Secondary | ICD-10-CM | POA: Diagnosis not present

## 2024-03-14 NOTE — Progress Notes (Signed)
 Established Patient Office Visit  Subjective   Patient ID: Savannah Wise, female    DOB: November 11, 1970  Age: 53 y.o. MRN: [id number]  Chief Complaint  Patient presents with   Chest Pain   Back Pain   Shortness of Breath    HPI   Savannah Wise is seen today with somewhat atypical chest pain past couple weeks or so.  She describes a sharp, twisting pain which is substernal and radiates somewhat to the right side.  Pain usually last about 30 seconds to 1 minute.  Not related to exertion.  For example, she walked 35 to 40 minutes yesterday outdoors with no chest pain.  She does have history of CAD.  She has history of couple of stents that were placed 2021 in [place] and what sounds like her LAD.  Her sharp pain is worse lying down.  She does have history of some reflux and has had previous gastric bypass surgery but denies any active GERD symptoms.  Her pain is actually better with certain movements.  She denies any dyspnea with activity.  Non-smoker.  Past medical history significant for CAD, hypertension, type 2 diabetes, dyslipidemia, thalassemia minor  Past Medical History:  Diagnosis Date   Anemia    mild   Diabetes mellitus without complication (HCC)    type 2 dx 2013   Dizzy spells    Exogenous obesity    mild   HA (headache)    every morning   Heart disease    Hypertension    Hypertriglyceridemia    Shortness of breath dyspnea    with walking-EKG and check up done   Past Surgical History:  Procedure Laterality Date   Bariactric Surgery  06/27/2016   BREAST BIOPSY Left 1991   BREAST SURGERY Left    breast biopsy 2010   HERNIA REPAIR  2006   abdominal   INSERTION OF MESH N/A 04/15/2015   Procedure: INSERTION OF MESH;  Surgeon: Adalberto Hollow, MD;  Location: [facility];  Service: General;  Laterality: N/A;   LAPAROSCOPIC LYSIS OF ADHESIONS N/A 04/15/2015   Procedure: LAPAROSCOPIC LYSIS OF ADHESIONS;  Surgeon: Adalberto Hollow, MD;  Location: [facility];  Service:  General;  Laterality: N/A;   VENTRAL HERNIA REPAIR N/A 04/15/2015   Procedure: LAPAROSCOPIC RECURRENT VENTRAL HERNIA REPAIR WITH MESH;  Surgeon: Adalberto Hollow, MD;  Location: [facility];  Service: General;  Laterality: N/A;    reports that she has never smoked. She has never used smokeless tobacco. She reports that she does not drink alcohol and does not use drugs. family history includes Arthritis in her father; Atrial fibrillation in her mother; COPD in her mother; Diabetes in her father and mother; Heart disease in her father and mother; Hyperlipidemia in her father and mother; Hypertension in her father and mother. Allergies  Allergen Reactions   Iodinated Contrast Media Hives and Rash    S/P 10 MIN AFTER RECEIVING CONTRAST DEVELOPED HIVES UPPER CHEST /NECK AND LEGS , PATIENT RECEIVED 50 MG BENADRYL/MMS   Omnipaque  [Iohexol ] Hives, Itching and Swelling    Review of Systems  Constitutional:  Negative for chills and fever.  Respiratory:  Negative for cough and shortness of breath.   Cardiovascular:  Positive for chest pain. Negative for palpitations, leg swelling and PND.  Gastrointestinal:  Negative for abdominal pain, nausea and vomiting.      Objective:     BP 110/74 (BP Location: Left Arm, Patient Position: Sitting, Cuff Size: Normal)   Pulse 68  Temp 98.6 F (37 C) (Oral)   Wt 158 lb 14.4 oz (72.1 kg)   SpO2 98%   BMI 27.80 kg/m  BP Readings from Last 3 Encounters:  03/14/24 110/74  05/21/23 126/82  05/17/22 126/80   Wt Readings from Last 3 Encounters:  03/14/24 158 lb 14.4 oz (72.1 kg)  05/21/23 159 lb 8 oz (72.3 kg)  05/17/22 170 lb 6.4 oz (77.3 kg)      Physical Exam Vitals reviewed.  Constitutional:      General: She is not in acute distress.    Appearance: She is well-developed.   Eyes:     Pupils: Pupils are equal, round, and reactive to light.   Neck:     Thyroid : No thyromegaly.     Vascular: No JVD.   Cardiovascular:     Rate and Rhythm: Normal  rate and regular rhythm.     Heart sounds:     No gallop.  Pulmonary:     Effort: Pulmonary effort is normal. No respiratory distress.     Breath sounds: Normal breath sounds. No wheezing or rales.   Musculoskeletal:     Cervical back: Neck supple.   Neurological:     Mental Status: She is alert.      No results found for any visits on 03/14/24.  Last CBC Lab Results  Component Value Date   WBC 8.3 05/21/2023   HGB 11.0 Repeated and verified X2. (L) 05/21/2023   HCT 37.7 Repeated and verified X2. 05/21/2023   MCV 67.0 Repeated and verified X2. (L) 05/21/2023   MCH 19.0 (L) 04/12/2015   RDW 14.2 05/21/2023   PLT 333.0 05/21/2023   Last metabolic panel Lab Results  Component Value Date   GLUCOSE 95 05/21/2023   NA 139 05/21/2023   K 4.2 05/21/2023   CL 104 05/21/2023   CO2 26 05/21/2023   BUN 19 05/21/2023   CREATININE 0.64 05/21/2023   GFR 100.68 05/21/2023   CALCIUM 9.4 05/21/2023   PROT 7.0 05/21/2023   ALBUMIN 4.2 05/21/2023   BILITOT 0.4 05/21/2023   ALKPHOS 58 05/21/2023   AST 12 05/21/2023   ALT 7 05/21/2023   ANIONGAP 9 04/12/2015   Last lipids Lab Results  Component Value Date   CHOL 138 05/21/2023   HDL 47.80 05/21/2023   LDLCALC 69 05/21/2023   LDLDIRECT 63.9 04/15/2014   TRIG 107.0 05/21/2023   CHOLHDL 3 05/21/2023   Last hemoglobin A1c Lab Results  Component Value Date   HGBA1C 6.3 05/21/2023      The 10-year ASCVD risk score (Arnett DK, et al., 2019) is: 2.6%    Assessment & Plan:   Patient presents with 2-week history of atypical chest pain.  Atypical symptoms include brief duration of 30 seconds to 1 minute and sharp quality and right-sided location.  She has had no exertional symptoms whatsoever.  Etiology not clear.  EKG today shows sinus bradycardia with heart rate around 59 with no acute ST-T changes.  May be having some GERD related symptoms.  We suggested she consider trial of over-the-counter Pepcid 20 mg twice daily.   Follow-up for any dyspnea, exertional symptoms, or change in quality or severity of pain.  She has scheduled follow-up with cardiology July 1   Glean Lamy, MD

## 2024-03-14 NOTE — Patient Instructions (Signed)
 Consider trial of over the counter Pepcid 20 mg twice as needed for chest symptoms.

## 2024-03-23 NOTE — Progress Notes (Unsigned)
 Cardiology Office Note    Date:  03/26/2024  ID:  [address] Savannah Wise, Savannah Wise 18-Jun-1971, MRN [id number] PCP:  Micheal Wolm ORN, MD  Cardiologist:  Jerel Balding, MD  Electrophysiologist:  None   Chief Complaint: Palpitations   History of Present Illness: .    Savannah Wise is a 53 y.o. female with visit-pertinent history of type 2 diabetes mellitus, hypertension, CAD.  She previously was followed by Dr. Dominick and saw Dr. Mona briefly in 2017 before relocating to [place].  In 2021 while living in [place] she underwent a stress test per patient reports she overall did well on the treadmill for 9 minutes but based on imaging studies she is underwent a cardiac catheterization that showed evidence of significant coronary disease.  Per report she did not have angina pectoris at rest or during the stress test.  Per patient reports 3 stents were implanted however unclear in which vessels.  She was placed on treatment with clopidogrel and statin.  Dr. Balding believed it to be rosuvastatin as it was 5 mg.  She stopped the medication due to muscle cramps and generalized feeling of weakness.  Per Dr. Tyrone note patient was started on ATOZET which was reported a combination of atorvastatin and Zetia.  However she developed muscle fatigue and cramps again.  Patient was last seen in clinic on 05/16/2022 by Dr. Balding.  Patient was asymptomatic at time of office visit.  It was requested that patient bring stent cards to follow-up visit.  It was recommended that she start on Repatha .   Today she reports increased palpitations.  Patient reports that in the last 2 weeks that she has had episodes in which her heart she feels as though her heart is racing, notes that she will check it on a pulse oximeter and her heart rate is typically in the 80s, she notes this is fast for her.  She also reports that she has periods when she feels that her heart rate is low.  Reports that when her  heart rate feels fast she will have some slight chest tightness, resolves with laying down in bed, reports episodes typically last for around 5 minutes.  She also endorses intermittent dizziness for the last 2 weeks, not specifically associated with position changes.  Patient also reports a sharp fleeting pain in her chest that resolves with movement, denies any chest pain, tightness or pressure with exertion.  She also endorses low energy and fatigue that she notes has been ongoing for a significant number of years, denies any significant changes.  Patient reports that she only takes her blood pressure medications every 3 days, reports she has not yet taken today.  ROS: .   Today she denies shortness of breath, lower extremity edema, melena, hematuria, hemoptysis, diaphoresis, weakness, presyncope, syncope, orthopnea, and PND.  All other systems are reviewed and otherwise negative. Studies Reviewed: SABRA    EKG:  EKG is not ordered today.  EKG reviewed from 03/14/2024 indicating sinus bradycardia at 59 bpm. CV Studies: Cardiac studies reviewed are outlined and summarized above. Otherwise please see EMR for full report.     Current Reported Medications:.    No outpatient medications have been marked as taking for the 03/25/24 encounter (Office Visit) with Rahkim Rabalais D, NP.    Physical Exam:    VS:  BP 104/60   Pulse 83   Ht 5' 4 (1.626 m)   Wt 160 lb 9.6 oz (72.8 kg)  LMP 04/09/2015   SpO2 97%   BMI 27.57 kg/m    Wt Readings from Last 3 Encounters:  03/25/24 160 lb 9.6 oz (72.8 kg)  03/14/24 158 lb 14.4 oz (72.1 kg)  05/21/23 159 lb 8 oz (72.3 kg)    GEN: Well nourished, well developed in no acute distress NECK: No JVD; No carotid bruits CARDIAC: RRR, no murmurs, rubs, gallops RESPIRATORY:  Clear to auscultation without rales, wheezing or rhonchi  ABDOMEN: Soft, non-tender, non-distended EXTREMITIES:  No edema; No acute deformity     Asessement and Plan:.    CAD: Per Dr.  Tyrone prior notes patient had 3 stents placed while in [place] in 2021.  At time of last office visit in 2023 she was taking aspirin 100 mg daily and clopidogrel 75 mg daily. Today she reports that she was previously doing well overall. Patient notes 2 types of recent chest discomfort.  Notes when she feels as though her heart is racing she can have a sense of chest tightness that can last for around 4 to 5 minutes.  She also reports a sharp and fleeting chest discomfort in the center of her chest that improves with twisting or movements, this sounds musculoskeletal in nature.  We did discuss checking a nuclear stress test given her history and chest tightness with increased palpitations, patient deferred.  She reports that she will plan to follow-up with her cardiologist when she returns to [place] in later August.  Reviewed ED precautions with patient.  Continue aspirin 81 mg daily, Repatha .  Palpitations: Patient reports that for the last 2 weeks she has had intermittent episodes in which it feels as though she her heart is racing, notes that she will check it on a pulse oximeter and it will typically be in the 80s, she feels this is high for her.  She also notes some episodes in which she feels as though her heart rate is low.  She notes some increased fatigue however reports this has been ongoing for many years, denies any significant changes.  She reports that when she feels that she is having increased heart racing she is able to lay down and this typically resolves after a few minutes.  Patient agreeable to a 7-day cardiac monitor.  Will also check CBC, c-Met, mag and TSH.  Reviewed ED precautions.  Hyperlipidemia: Patient reports that her cardiologist in [place] has been monitoring her cholesterol, reports it is been very well-controlled.  She is currently on Repatha .  Hypertension: Blood pressure today 104/60, patient reports that she did not take her blood pressure medications  today.  She reports that she will typically only take her blood pressure medications only once every 3 days, reports that her blood pressure is very well-controlled.  Discussed with patient that this is likely contributing to her fatigue and dizzy spells as without blood pressure medications her blood pressure is already low normal.  Encouraged patient to stop taking valsartan  hydrochlorothiazide  and monitor her blood pressure at home, to notify the office if consistently elevated above 130/80.  She notes that she will also plan to follow-up with her cardiologist when she returns [place] next month.   Disposition: F/u with Jair Lindblad, NP in 4-5 weeks.   Signed, Audia Amick D Treylan Mcclintock, NP

## 2024-03-24 ENCOUNTER — Encounter (HOSPITAL_BASED_OUTPATIENT_CLINIC_OR_DEPARTMENT_OTHER): Payer: Self-pay

## 2024-03-25 ENCOUNTER — Encounter: Payer: Self-pay | Admitting: Cardiology

## 2024-03-25 ENCOUNTER — Ambulatory Visit: Attending: Cardiology | Admitting: Cardiology

## 2024-03-25 VITALS — BP 104/60 | HR 83 | Ht 64.0 in | Wt 160.6 lb

## 2024-03-25 DIAGNOSIS — I10 Essential (primary) hypertension: Secondary | ICD-10-CM | POA: Diagnosis not present

## 2024-03-25 DIAGNOSIS — E782 Mixed hyperlipidemia: Secondary | ICD-10-CM

## 2024-03-25 DIAGNOSIS — R002 Palpitations: Secondary | ICD-10-CM | POA: Diagnosis not present

## 2024-03-25 DIAGNOSIS — I2510 Atherosclerotic heart disease of native coronary artery without angina pectoris: Secondary | ICD-10-CM

## 2024-03-25 NOTE — Patient Instructions (Signed)
 Medication Instructions:  No changes *If you need a refill on your cardiac medications before your next appointment, please call your pharmacy*  Lab Work: Today we are going to CBC, Mag, Bmet, and TSH If you have labs (blood work) drawn today and your tests are completely normal, you will receive your results only by: MyChart Message (if you have MyChart) OR A paper copy in the mail If you have any lab test that is abnormal or we need to change your treatment, we will call you to review the results.  Testing/Procedures: Next page  Follow-Up: At [facility], you and your health needs are our priority.  As part of our continuing mission to provide you with exceptional heart care, our providers are all part of one team.  This team includes your primary Cardiologist (physician) and Advanced Practice Providers or APPs (Physician Assistants and Nurse Practitioners) who all work together to provide you with the care you need, when you need it.  Your next appointment:   Next page  We recommend signing up for the patient portal called MyChart.  Sign up information is provided on this After Visit Summary.  MyChart is used to connect with patients for Virtual Visits (Telemedicine).  Patients are able to view lab/test results, encounter notes, upcoming appointments, etc.  Non-urgent messages can be sent to your provider as well.   To learn more about what you can do with MyChart, go to [email or web address].   Other Instructions ZIO XT- Long Term Monitor Instructions  Your physician has requested you wear a ZIO patch monitor for 14 days.  This is a single patch monitor. [employer] supplies one patch monitor per enrollment. Additional stickers are not available. Please do not apply patch if you will be having a Nuclear Stress Test,  Echocardiogram, Cardiac CT, MRI, or Chest Xray during the period you would be wearing the  monitor. The patch cannot be worn during these tests. You cannot  remove and re-apply the  ZIO XT patch monitor.  Your ZIO patch monitor will be mailed 3 day USPS to your address on file. It may take 3-5 days  to receive your monitor after you have been enrolled.  Once you have received your monitor, please review the enclosed instructions. Your monitor  has already been registered assigning a specific monitor serial # to you.  Billing and Patient Assistance Program Information  We have supplied [employer] with any of your insurance information on file for billing purposes. [employer] offers a sliding scale Patient Assistance Program for patients that do not have  insurance, or whose insurance does not completely cover the cost of the ZIO monitor.  You must apply for the Patient Assistance Program to qualify for this discounted rate.  To apply, please call [employer] at [phone], select option 4, select option 2, ask to apply for  Patient Assistance Program. Meredeth will ask your household income, and how many people  are in your household. They will quote your out-of-pocket cost based on that information.  [employer] will also be able to set up a 73-month, interest-free payment plan if needed.  Applying the monitor   Shave hair from upper left chest.  Hold abrader disc by orange tab. Rub abrader in 40 strokes over the upper left chest as  indicated in your monitor instructions.  Clean area with 4 enclosed alcohol pads. Let dry.  Apply patch as indicated in monitor instructions. Patch will be placed under collarbone on left  side of chest with arrow  pointing upward.  Rub patch adhesive wings for 2 minutes. Remove white label marked 1. Remove the white  label marked 2. Rub patch adhesive wings for 2 additional minutes.  While looking in a mirror, press and release button in center of patch. A small green light will  flash 3-4 times. This will be your only indicator that the monitor has been turned on.  Do not shower for the first 24 hours. You may shower  after the first 24 hours.  Press the button if you feel a symptom. You will hear a small click. Record Date, Time and  Symptom in the Patient Logbook.  When you are ready to remove the patch, follow instructions on the last 2 pages of Patient  Logbook. Stick patch monitor onto the last page of Patient Logbook.  Place Patient Logbook in the blue and white box. Use locking tab on box and tape box closed  securely. The blue and white box has prepaid postage on it. Please place it in the mailbox as  soon as possible. Your physician should have your test results approximately 7 days after the  monitor has been mailed back to [facility].  Call [facility] Customer Care at [phone] if you have questions regarding  your ZIO XT patch monitor. Call them immediately if you see an orange light blinking on your  monitor.  If your monitor falls off in less than 4 days, contact our Monitor department at [phone].  If your monitor becomes loose or falls off after 4 days call [employer] at [phone] for  suggestions on securing your monitor

## 2024-03-26 ENCOUNTER — Ambulatory Visit: Payer: Self-pay | Admitting: Cardiology

## 2024-03-26 ENCOUNTER — Encounter: Payer: Self-pay | Admitting: Cardiology

## 2024-03-26 LAB — CBC
Hematocrit: 39 % (ref 34.0–46.6)
Hemoglobin: 11.2 g/dL (ref 11.1–15.9)
MCH: 19.7 pg — ABNORMAL LOW (ref 26.6–33.0)
MCHC: 28.7 g/dL — ABNORMAL LOW (ref 31.5–35.7)
MCV: 69 fL — ABNORMAL LOW (ref 79–97)
Platelets: 324 x10E3/uL (ref 150–450)
RBC: 5.69 x10E6/uL — ABNORMAL HIGH (ref 3.77–5.28)
RDW: 13.6 % (ref 11.7–15.4)
WBC: 10 x10E3/uL (ref 3.4–10.8)

## 2024-03-26 LAB — TSH: TSH: 1.23 u[IU]/mL (ref 0.450–4.500)

## 2024-03-26 LAB — BASIC METABOLIC PANEL WITH GFR
BUN/Creatinine Ratio: 23 (ref 9–23)
BUN: 17 mg/dL (ref 6–24)
CO2: 21 mmol/L (ref 20–29)
Calcium: 9.4 mg/dL (ref 8.7–10.2)
Chloride: 100 mmol/L (ref 96–106)
Creatinine, Ser: 0.75 mg/dL (ref 0.57–1.00)
Glucose: 88 mg/dL (ref 70–99)
Potassium: 4.7 mmol/L (ref 3.5–5.2)
Sodium: 138 mmol/L (ref 134–144)
eGFR: 95 mL/min/{1.73_m2} (ref >59)

## 2024-03-26 LAB — MAGNESIUM: Magnesium: 2.2 mg/dL (ref 1.6–2.3)

## 2024-04-02 ENCOUNTER — Ambulatory Visit: Attending: Cardiology

## 2024-04-02 ENCOUNTER — Other Ambulatory Visit: Payer: Self-pay | Admitting: Cardiology

## 2024-04-02 DIAGNOSIS — I2510 Atherosclerotic heart disease of native coronary artery without angina pectoris: Secondary | ICD-10-CM

## 2024-04-02 DIAGNOSIS — I10 Essential (primary) hypertension: Secondary | ICD-10-CM

## 2024-04-02 DIAGNOSIS — E782 Mixed hyperlipidemia: Secondary | ICD-10-CM

## 2024-04-02 DIAGNOSIS — R002 Palpitations: Secondary | ICD-10-CM

## 2024-04-02 NOTE — Progress Notes (Unsigned)
 Applied a 14 day Zio XT monitor to patient in the office  Croitoru to read

## 2024-04-17 DIAGNOSIS — I10 Essential (primary) hypertension: Secondary | ICD-10-CM | POA: Diagnosis not present

## 2024-04-17 DIAGNOSIS — R002 Palpitations: Secondary | ICD-10-CM | POA: Diagnosis not present

## 2024-04-18 DIAGNOSIS — I10 Essential (primary) hypertension: Secondary | ICD-10-CM | POA: Diagnosis not present

## 2024-04-18 DIAGNOSIS — I2510 Atherosclerotic heart disease of native coronary artery without angina pectoris: Secondary | ICD-10-CM

## 2024-04-18 DIAGNOSIS — R002 Palpitations: Secondary | ICD-10-CM | POA: Diagnosis not present

## 2024-04-23 ENCOUNTER — Ambulatory Visit: Payer: Self-pay | Admitting: Cardiology

## 2024-04-23 NOTE — Telephone Encounter (Signed)
-----   Message from Katlyn D West sent at 04/23/2024 10:21 AM EDT ----- Please let Ms. Khokhar know that her cardiac monitor showed her dominant cardiac rhythm was normal sinus rhythm, she had rare single early beats from the top and bottom chambers of the heart. She had  occasional and short episodes of fast heart beats originating from the top chambers of the heart. Her triggered events were associated with normal rhythm. Overall good results, no recommended  medication changes at this time.  ----- Message ----- From: Francyne Headland, MD Sent: 04/18/2024   5:52 PM EDT To: Katlyn D West, NP

## 2024-04-23 NOTE — Telephone Encounter (Signed)
 Left message to call back

## 2024-04-25 ENCOUNTER — Ambulatory Visit: Admitting: Cardiology

## 2024-05-01 ENCOUNTER — Telehealth: Payer: Self-pay

## 2024-05-01 NOTE — Telephone Encounter (Signed)
-----   Message from Katlyn D West sent at 04/23/2024 10:21 AM EDT ----- Please let Ms. Khokhar know that her cardiac monitor showed her dominant cardiac rhythm was normal sinus rhythm, she had rare single early beats from the top and bottom chambers of the heart. She had  occasional and short episodes of fast heart beats originating from the top chambers of the heart. Her triggered events were associated with normal rhythm. Overall good results, no recommended  medication changes at this time.  ----- Message ----- From: Francyne Headland, MD Sent: 04/18/2024   5:52 PM EDT To: Katlyn D West, NP

## 2024-05-01 NOTE — Telephone Encounter (Signed)
 Called patient to get them scheduled for a f/u with Katlyn West NP at 1:55 on the 7th Or the f/u with Damien Braver NP on 8/12 at 2:45

## 2024-05-01 NOTE — Telephone Encounter (Signed)
 Called patient advised of below they verbalized understanding.

## 2024-05-06 ENCOUNTER — Ambulatory Visit: Attending: Nurse Practitioner | Admitting: Nurse Practitioner

## 2024-05-06 ENCOUNTER — Encounter: Payer: Self-pay | Admitting: Nurse Practitioner

## 2024-05-06 VITALS — BP 110/72 | HR 78 | Ht 64.0 in | Wt 169.0 lb

## 2024-05-06 DIAGNOSIS — E785 Hyperlipidemia, unspecified: Secondary | ICD-10-CM | POA: Diagnosis not present

## 2024-05-06 DIAGNOSIS — I10 Essential (primary) hypertension: Secondary | ICD-10-CM | POA: Diagnosis not present

## 2024-05-06 DIAGNOSIS — R002 Palpitations: Secondary | ICD-10-CM | POA: Diagnosis not present

## 2024-05-06 DIAGNOSIS — E119 Type 2 diabetes mellitus without complications: Secondary | ICD-10-CM

## 2024-05-06 DIAGNOSIS — I2510 Atherosclerotic heart disease of native coronary artery without angina pectoris: Secondary | ICD-10-CM

## 2024-05-06 NOTE — Progress Notes (Signed)
 Office Visit    Patient Name: Savannah Wise Date of Encounter: 05/06/2024  Primary Care Provider:  Micheal Wolm ORN, MD Primary Cardiologist:  Jerel Balding, MD  Chief Complaint    53 year old female with a history of CAD with prior stenting while in [place], palpitations, rare PACs and PVCs, hypertension, hyperlipidemia, and type 2 diabetes who presents for follow-up related to palpitations.  Past Medical History    Past Medical History:  Diagnosis Date   Anemia    mild   Diabetes mellitus without complication (HCC)    type 2 dx 2013   Dizzy spells    Exogenous obesity    mild   HA (headache)    every morning   Heart disease    Hypertension    Hypertriglyceridemia    Shortness of breath dyspnea    with walking-EKG and check up done   Past Surgical History:  Procedure Laterality Date   Bariactric Surgery  06/27/2016   BREAST BIOPSY Left 1991   BREAST SURGERY Left    breast biopsy 2010   HERNIA REPAIR  2006   abdominal   INSERTION OF MESH N/A 04/15/2015   Procedure: INSERTION OF MESH;  Surgeon: Krystal Russell, MD;  Location: [facility];  Service: General;  Laterality: N/A;   LAPAROSCOPIC LYSIS OF ADHESIONS N/A 04/15/2015   Procedure: LAPAROSCOPIC LYSIS OF ADHESIONS;  Surgeon: Krystal Russell, MD;  Location: [facility];  Service: General;  Laterality: N/A;   VENTRAL HERNIA REPAIR N/A 04/15/2015   Procedure: LAPAROSCOPIC RECURRENT VENTRAL HERNIA REPAIR WITH MESH;  Surgeon: Krystal Russell, MD;  Location: [facility];  Service: General;  Laterality: N/A;    Allergies  Allergies  Allergen Reactions   Iodinated Contrast Media Hives and Rash    S/P 10 MIN AFTER RECEIVING CONTRAST DEVELOPED HIVES UPPER CHEST /NECK AND LEGS , PATIENT RECEIVED 50 MG BENADRYL/MMS   Omnipaque  [Iohexol ] Hives, Itching and Swelling     Labs/Other Studies Reviewed    The following studies were reviewed today:  Cardiac Studies & Procedures    ______________________________________________________________________________________________        Savannah  Wise TERM MONITOR (3-14 DAYS) 04/18/2024  Narrative   The dominant rhythm was normal sinus rhythm with normal circadian variation.   There are rare premature supraventricular beats.  There are a few very brief episodes of nonsustained ectopic atrial tachycardia (maximum 12 beats).  There is no evidence of atrial fibrillation.   There are rare premature ventricular beats and there is no complex ventricular arrhythmia.   There is no evidence of severe bradycardia, pauses or high-grade AV block.   During patient symptom-triggered recordings, normal rhythm is seen.  Mostly normal arrhythmia monitor, with the exception of a few very brief episodes of nonsustained atrial tachycardia.  Atrial fibrillation was not seen. There is poor correlation between patient's symptoms and the few recorded rhythm abnormalities.   Patch Wear Time:  14 days and 0 hours (2025-07-09T09:40:21-0400 to 2025-07-23T09:40:21-0400)  Patient had a min HR of 51 bpm, max HR of 158 bpm, and avg HR of 76 bpm. Predominant underlying rhythm was Sinus Rhythm. Slight P wave morphology changes were noted. 15 Supraventricular Tachycardia runs occurred, the run with the fastest interval lasting 6 beats with a max rate of 158 bpm, the longest lasting 12 beats with an avg rate of 111 bpm. Some episodes of Supraventricular Tachycardia may be possible Atrial Tachycardia with variable block. Isolated SVEs were rare (<1.0%), SVE Couplets were rare (<1.0%), and SVE Triplets  were rare (<1.0%). Isolated VEs were rare (<1.0%, 1400), VE Couplets were rare (<1.0%, 1), and VE Triplets were rare (<1.0%, 1).       ______________________________________________________________________________________________     Recent Labs: 05/21/2023: ALT 7 03/25/2024: BUN 17; Creatinine, Ser 0.75; Hemoglobin 11.2; Magnesium 2.2; Platelets 324;  Potassium 4.7; Sodium 138; TSH 1.230  Recent Lipid Panel    Component Value Date/Time   CHOL 138 05/21/2023 1107   TRIG 107.0 05/21/2023 1107   HDL 47.80 05/21/2023 1107   CHOLHDL 3 05/21/2023 1107   VLDL 21.4 05/21/2023 1107   LDLCALC 69 05/21/2023 1107   LDLDIRECT 63.9 04/15/2014 1029    History of Present Illness    53 year old female with the above past medical history including CAD with prior stenting while in [place], palpitations, rare PACs and PVCs, hypertension, hyperlipidemia, and type 2 diabetes.   She previously followed by Dr. Dominick, she previously followed with Dr. Mona in 2017 before relocating to [place].  She underwent cardiac catheterization in [place] which showed evidence of significant coronary artery disease, per patient report, she had 3 stents placed, records not available, unknown size and vessel.  She later established with Dr. Francyne.  She was last seen in the office on 03/25/2024 and reported increased palpitations x 2 weeks.  Cardiac monitor revealed predominantly normal sinus rhythm, rare PACs and PVCs, few episodes of nonsustained ectopic atrial tachycardia, maximum lasting 12 beats, no significant arrhythmia.  Patient symptom triggers correlated with normal sinus rhythm.  She presents today for follow-up.  Since her last visit she has been stable from a cardiac standpoint.  She notes rare fleeting palpitations, denies any associated symptoms, denies symptoms concerning for angina.  Overall, she reports feeling well.  She is leaving on Monday to return to [place].    Home Medications    Current Outpatient Medications  Medication Sig Dispense Refill   aspirin EC 81 MG tablet Take 81 mg by mouth daily.     Evolocumab  (REPATHA ) 140 MG/ML SOSY Inject 140 mg into the skin every 14 (fourteen) days. 6 mL 1   valsartan -hydrochlorothiazide  (DIOVAN -HCT) 80-12.5 MG tablet Take 1 tablet by mouth daily. 90 tablet 3   Vitamin D , Ergocalciferol ,  (DRISDOL ) 50000 units CAPS capsule TAKE ONE CAPSULE BY MOUTH EVERY 7 DAYS. 12 capsule 1   No current facility-administered medications for this visit.     Review of Systems    She denies chest pain, palpitations, dyspnea, pnd, orthopnea, n, v, dizziness, syncope, edema, weight gain, or early satiety. All other systems reviewed and are otherwise negative except as noted above.   Physical Exam    VS:  BP 110/72   Pulse 78   Ht 5' 4 (1.626 m)   Wt 169 lb (76.7 kg)   LMP 04/09/2015   SpO2 98%   BMI 29.01 kg/m   GEN: Well nourished, well developed, in no acute distress. HEENT: normal. Neck: Supple, no JVD, carotid bruits, or masses. Cardiac: RRR, no murmurs, rubs, or gallops. No clubbing, cyanosis, edema.  Radials/DP/PT 2+ and equal bilaterally.  Respiratory:  Respirations regular and unlabored, clear to auscultation bilaterally. GI: Soft, nontender, nondistended, BS + x 4. MS: no deformity or atrophy. Skin: warm and dry, no rash. Neuro:  Strength and sensation are intact. Psych: Normal affect.  Accessory Clinical Findings    ECG personally reviewed by me today -    - no EKG in office today.    Lab Results  Component Value Date  WBC 10.0 03/25/2024   HGB 11.2 03/25/2024   HCT 39.0 03/25/2024   MCV 69 (L) 03/25/2024   PLT 324 03/25/2024   Lab Results  Component Value Date   CREATININE 0.75 03/25/2024   BUN 17 03/25/2024   NA 138 03/25/2024   K 4.7 03/25/2024   CL 100 03/25/2024   CO2 21 03/25/2024   Lab Results  Component Value Date   ALT 7 05/21/2023   AST 12 05/21/2023   ALKPHOS 58 05/21/2023   BILITOT 0.4 05/21/2023   Lab Results  Component Value Date   CHOL 138 05/21/2023   HDL 47.80 05/21/2023   LDLCALC 69 05/21/2023   LDLDIRECT 63.9 04/15/2014   TRIG 107.0 05/21/2023   CHOLHDL 3 05/21/2023    Lab Results  Component Value Date   HGBA1C 6.3 05/21/2023    Assessment & Plan    1. CAD: Cardiac catheterization in [place] which showed evidence  of significant coronary artery disease, per patient report, she had 3 stents placed, records not available, unknown size and vessel.  Stable with no anginal symptoms. No indication for ischemic evaluation.  Continue aspirin, valsartan -HCTZ, Repatha .  2. Palpitations: Cardiac monitor in 03/2024 revealed predominantly normal sinus rhythm, rare PACs and PVCs, few episodes of nonsustained ectopic atrial tachycardia, maximum lasting 12 beats, no significant arrhythmia.  Patient symptom triggers correlated with normal sinus rhythm.  She reports rare fleeting palpitations, denies any associated symptoms, overall stable.  3. Hypertension: BP well controlled. Continue current antihypertensive regimen.   4. Hyperlipidemia: LDL was 69 in 04/2023.  Continue Repatha .  5. Type 2 diabetes: A1c was 6.3 in 04/2023.  Managed per PCP.  6. Disposition: Follow-up as needed with Dr. Francyne, she will call when she is ready for an appointment.  In the meantime, she will follow with her cardiologist in [place].      Damien JAYSON Braver, NP 05/06/2024, 2:55 PM

## 2024-05-06 NOTE — Patient Instructions (Signed)
 Medication Instructions:  Your physician recommends that you continue on your current medications as directed. Please refer to the Current Medication list given to you today.  *If you need a refill on your cardiac medications before your next appointment, please call your pharmacy*  Lab Work: NONE ordered at this time of appointment   Testing/Procedures: NONE ordered at this time of appointment   Follow-Up: At [facility], you and your health needs are our priority.  As part of our continuing mission to provide you with exceptional heart care, our providers are all part of one team.  This team includes your primary Cardiologist (physician) and Advanced Practice Providers or APPs (Physician Assistants and Nurse Practitioners) who all work together to provide you with the care you need, when you need it.  Your next appointment:    As needed   Provider:   Jerel Balding, MD    We recommend signing up for the patient portal called MyChart.  Sign up information is provided on this After Visit Summary.  MyChart is used to connect with patients for Virtual Visits (Telemedicine).  Patients are able to view lab/test results, encounter notes, upcoming appointments, etc.  Non-urgent messages can be sent to your provider as well.   To learn more about what you can do with MyChart, go to [email or web address].

## 2024-05-08 ENCOUNTER — Encounter: Payer: Self-pay | Admitting: Nurse Practitioner

## 2024-07-28 ENCOUNTER — Telehealth: Payer: Self-pay | Admitting: Family Medicine

## 2024-07-28 NOTE — Telephone Encounter (Signed)
 Lmom asking pt to callback to sch cpe

## 2024-07-28 NOTE — Telephone Encounter (Signed)
-----   Message from Jon VEAR Lindau sent at 07/27/2024  2:49 PM EST ----- Hello,  Patient is past due for CPE visit with PCP. Was due 04/2024.  Can someone please outreach patient to try to get visit scheduled prior to end of year?  Thank you! Jon VEAR Lindau, PharmD Clinical Pharmacist [phone]

## 2024-08-13 NOTE — Telephone Encounter (Signed)
 Lmom asking pt callback
